# Patient Record
Sex: Female | Born: 1960 | Race: White | Hispanic: No | Marital: Married | Smoking: Former smoker
Health system: Southern US, Community
[De-identification: ages and names within clinical notes are randomized; demographics above are authoritative.]

## PROBLEM LIST (undated history)

## (undated) ENCOUNTER — Emergency Department (HOSPITAL_COMMUNITY): Payer: Self-pay

## (undated) DIAGNOSIS — E785 Hyperlipidemia, unspecified: Secondary | ICD-10-CM

## (undated) DIAGNOSIS — M952 Other acquired deformity of head: Secondary | ICD-10-CM

## (undated) DIAGNOSIS — F17200 Nicotine dependence, unspecified, uncomplicated: Secondary | ICD-10-CM

## (undated) DIAGNOSIS — E049 Nontoxic goiter, unspecified: Secondary | ICD-10-CM

## (undated) DIAGNOSIS — E663 Overweight: Secondary | ICD-10-CM

## (undated) DIAGNOSIS — IMO0002 Reserved for concepts with insufficient information to code with codable children: Secondary | ICD-10-CM

## (undated) DIAGNOSIS — F419 Anxiety disorder, unspecified: Secondary | ICD-10-CM

## (undated) DIAGNOSIS — G4726 Circadian rhythm sleep disorder, shift work type: Secondary | ICD-10-CM

## (undated) DIAGNOSIS — D069 Carcinoma in situ of cervix, unspecified: Secondary | ICD-10-CM

## (undated) DIAGNOSIS — B977 Papillomavirus as the cause of diseases classified elsewhere: Secondary | ICD-10-CM

## (undated) DIAGNOSIS — K5190 Ulcerative colitis, unspecified, without complications: Secondary | ICD-10-CM

## (undated) HISTORY — DX: Overweight: E66.3

## (undated) HISTORY — DX: Reserved for concepts with insufficient information to code with codable children: IMO0002

## (undated) HISTORY — DX: Nontoxic goiter, unspecified: E04.9

## (undated) HISTORY — DX: Nicotine dependence, unspecified, uncomplicated: F17.200

## (undated) HISTORY — PX: COLPOSCOPY W/ BIOPSY / CURETTAGE: SUR283

## (undated) HISTORY — DX: Anxiety disorder, unspecified: F41.9

## (undated) HISTORY — DX: Ulcerative colitis, unspecified, without complications: K51.90

## (undated) HISTORY — DX: Papillomavirus as the cause of diseases classified elsewhere: B97.7

## (undated) HISTORY — DX: Other acquired deformity of head: M95.2

## (undated) HISTORY — DX: Circadian rhythm sleep disorder, shift work type: G47.26

## (undated) HISTORY — DX: Hyperlipidemia, unspecified: E78.5

---

## 1994-11-18 DIAGNOSIS — K5190 Ulcerative colitis, unspecified, without complications: Secondary | ICD-10-CM

## 1994-11-18 HISTORY — DX: Ulcerative colitis, unspecified, without complications: K51.90

## 2003-01-26 ENCOUNTER — Ambulatory Visit (HOSPITAL_COMMUNITY): Admission: RE | Admit: 2003-01-26 | Discharge: 2003-01-26 | Payer: Self-pay | Admitting: Gastroenterology

## 2003-01-26 ENCOUNTER — Encounter (INDEPENDENT_AMBULATORY_CARE_PROVIDER_SITE_OTHER): Payer: Self-pay

## 2003-04-12 ENCOUNTER — Emergency Department (HOSPITAL_COMMUNITY): Admission: EM | Admit: 2003-04-12 | Discharge: 2003-04-12 | Payer: Self-pay

## 2005-07-17 ENCOUNTER — Ambulatory Visit: Payer: Self-pay | Admitting: Family Medicine

## 2005-08-01 ENCOUNTER — Ambulatory Visit: Payer: Self-pay | Admitting: Family Medicine

## 2008-12-08 ENCOUNTER — Ambulatory Visit: Payer: Self-pay | Admitting: Family Medicine

## 2008-12-21 ENCOUNTER — Ambulatory Visit: Payer: Self-pay | Admitting: Internal Medicine

## 2009-02-02 ENCOUNTER — Ambulatory Visit: Payer: Self-pay | Admitting: Unknown Physician Specialty

## 2009-03-28 ENCOUNTER — Other Ambulatory Visit: Admission: RE | Admit: 2009-03-28 | Discharge: 2009-03-28 | Payer: Self-pay | Admitting: Interventional Radiology

## 2009-03-28 ENCOUNTER — Encounter: Admission: RE | Admit: 2009-03-28 | Discharge: 2009-03-28 | Payer: Self-pay | Admitting: Surgery

## 2009-03-28 ENCOUNTER — Encounter (INDEPENDENT_AMBULATORY_CARE_PROVIDER_SITE_OTHER): Payer: Self-pay | Admitting: Interventional Radiology

## 2010-04-24 ENCOUNTER — Ambulatory Visit: Payer: Self-pay | Admitting: Otolaryngology

## 2010-12-24 IMAGING — MG MAM DIG ADDVIEWS RT SCR
1 series · 4 of 4 positions shown · non-contrast
Comparison: none

REASON FOR EXAM: right breast aysmmetrical spieculated density  US to
follow  spanish interpr...
COMMENTS:

[R ML · right · 4 of 4 slices shown]
[im 1/4]
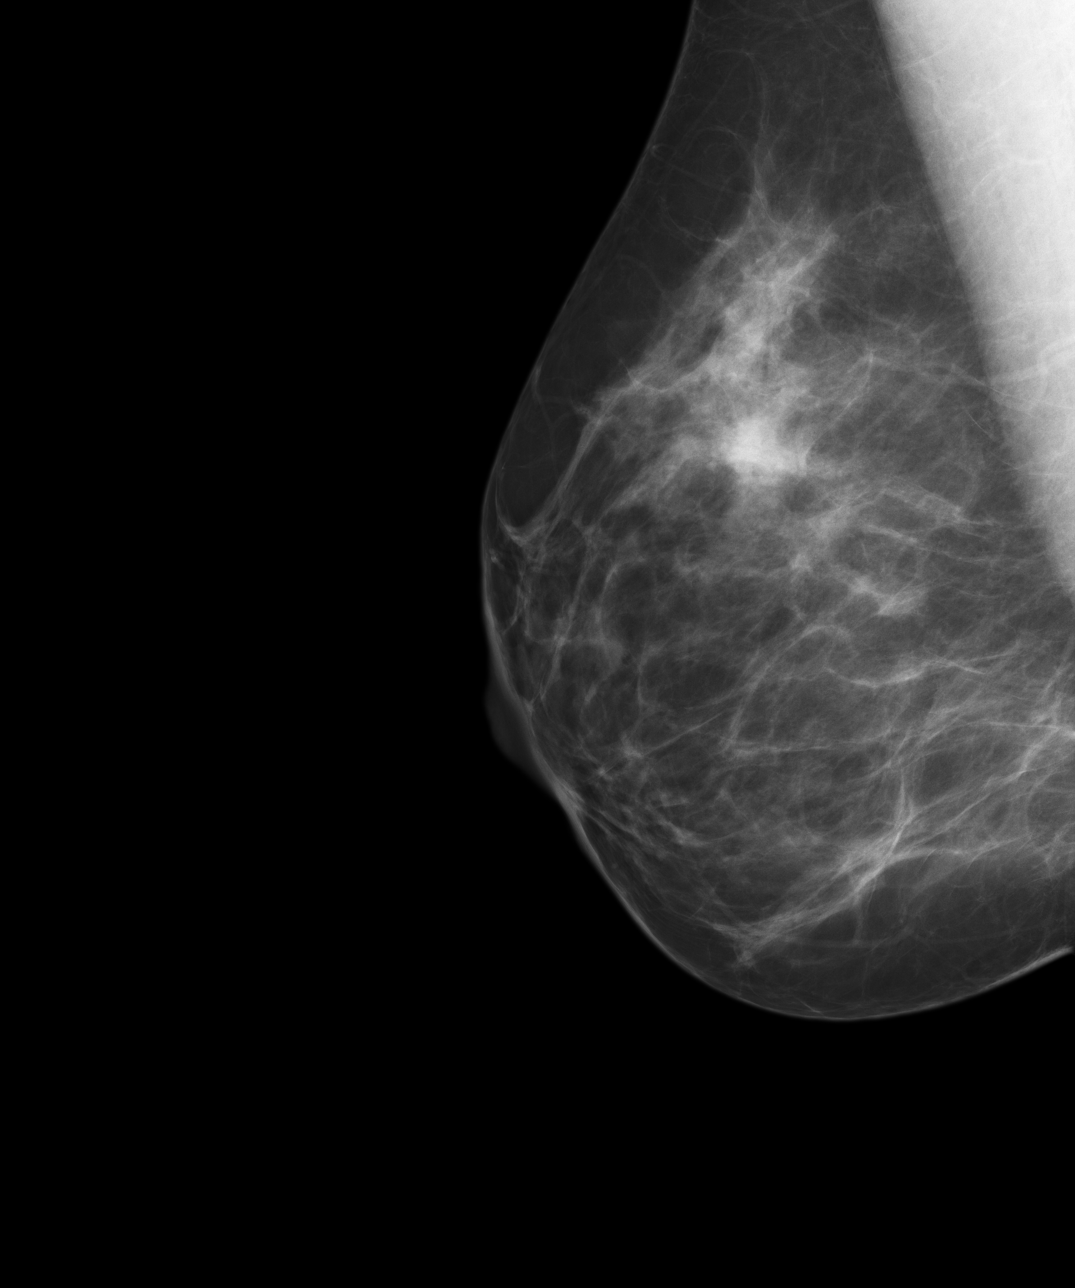
[im 2/4]
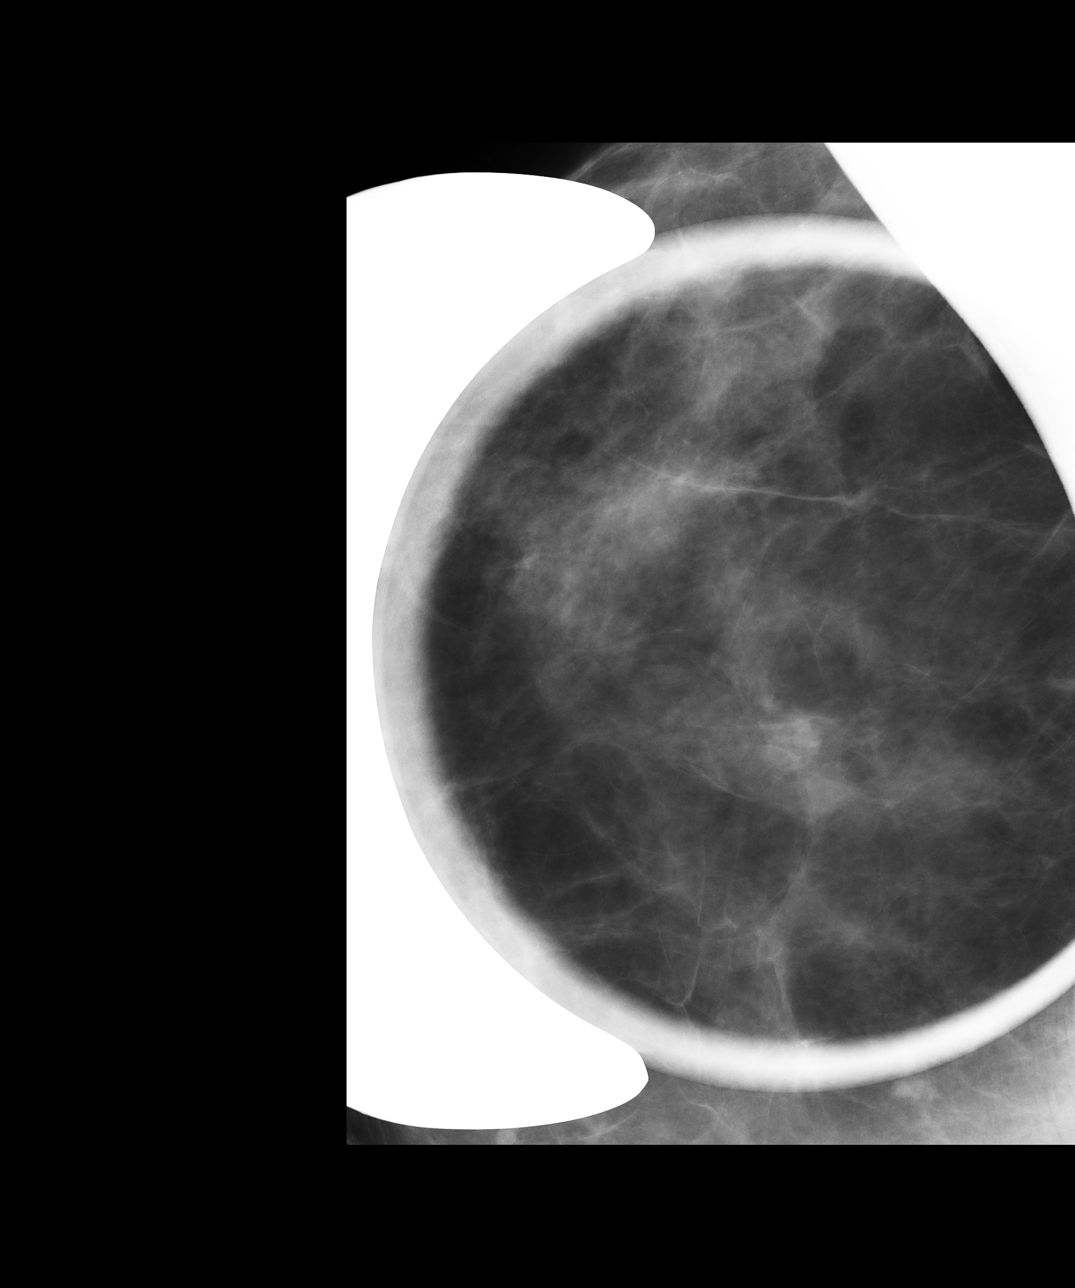
[im 3/4]
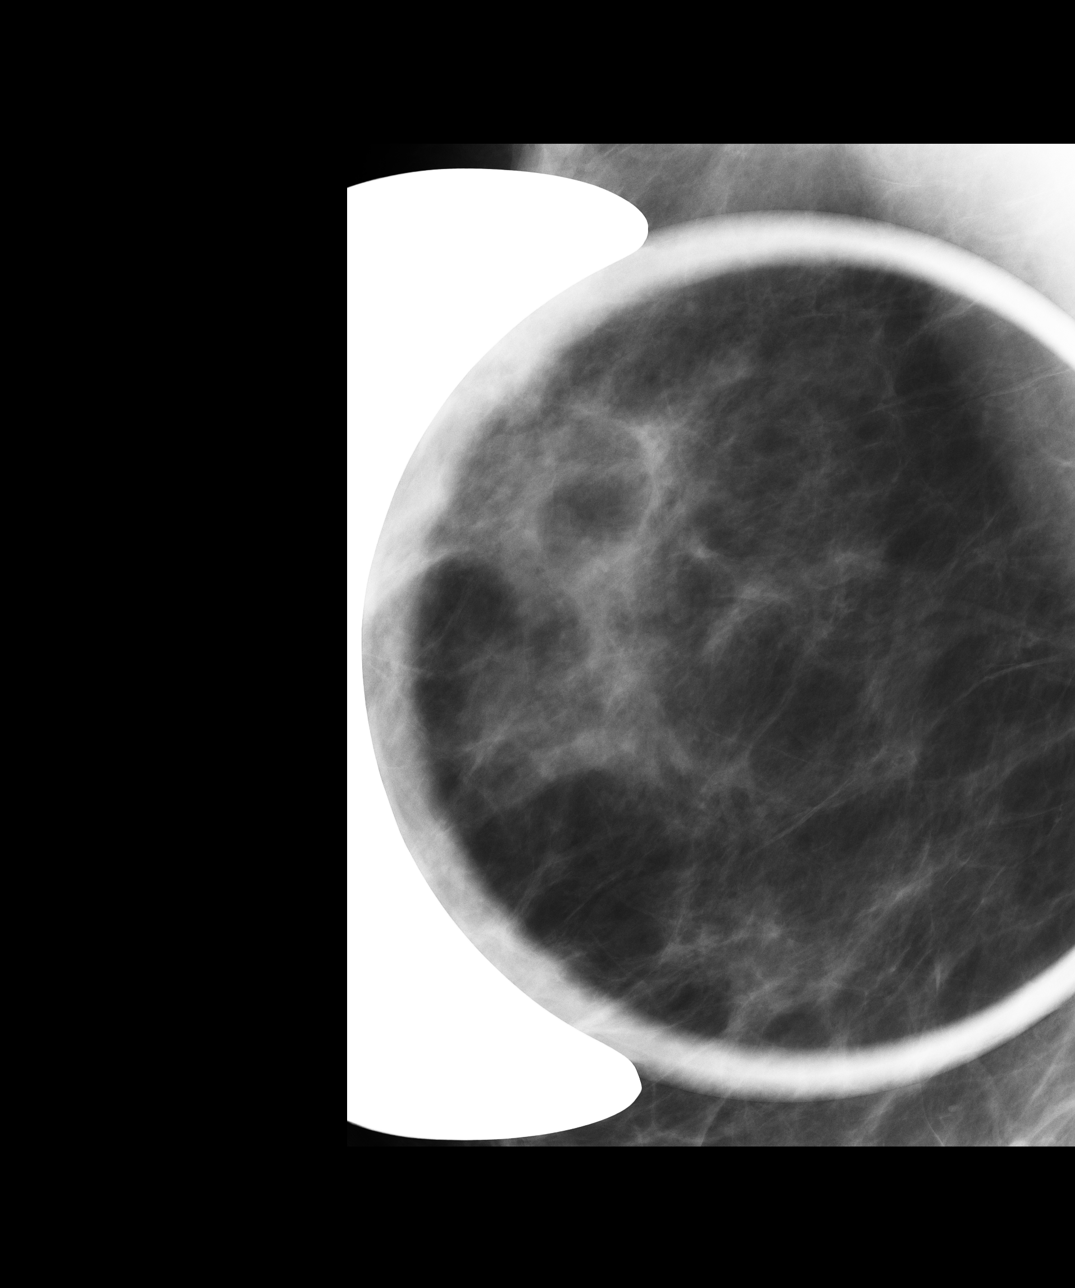
[im 4/4]
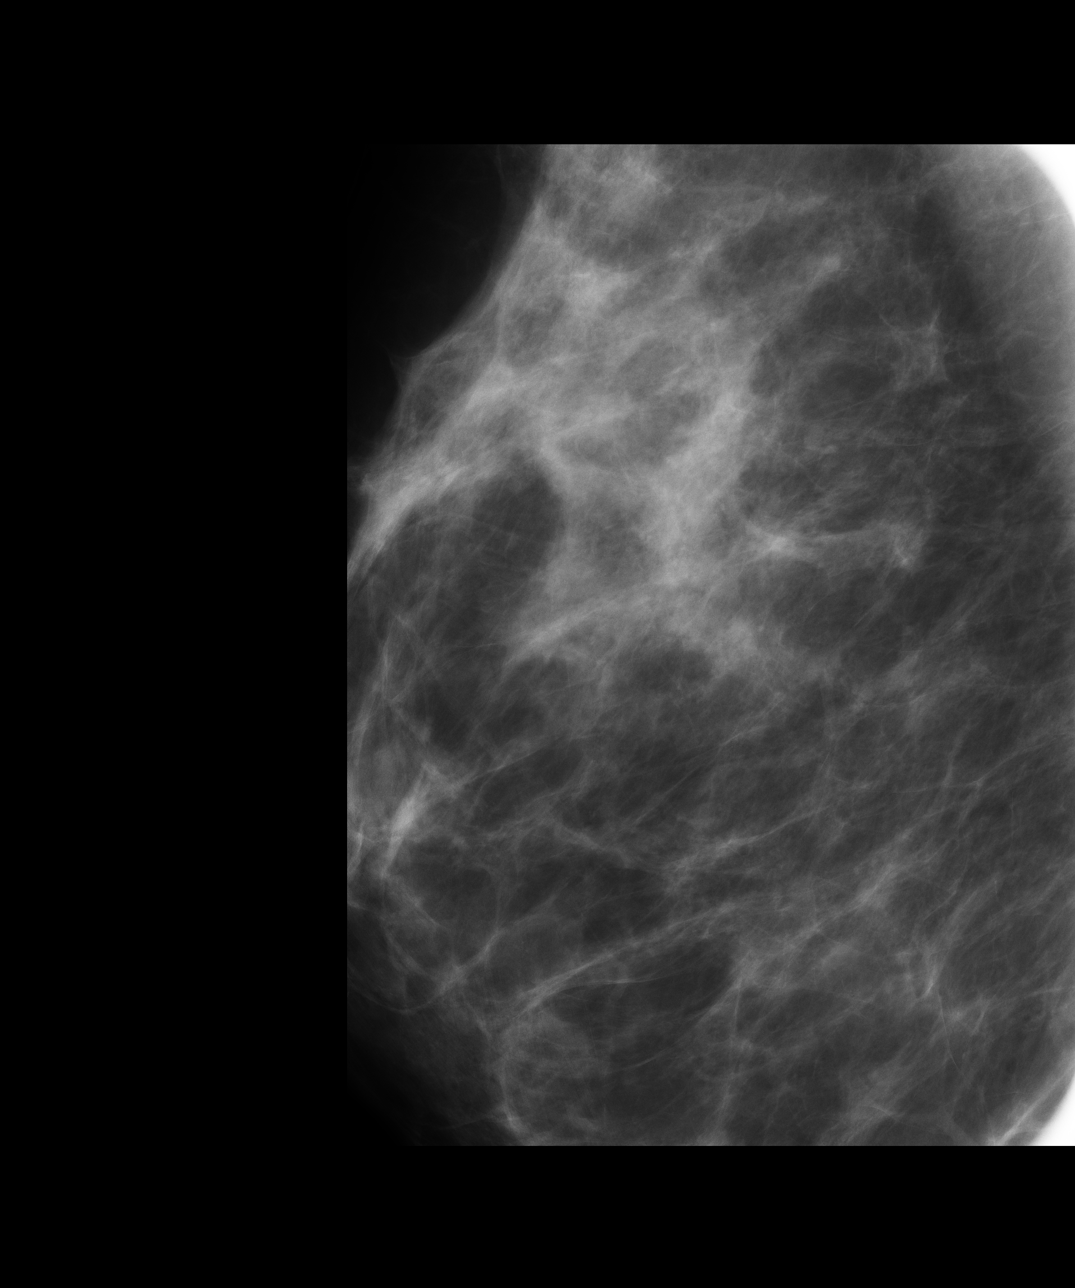

[4 of 4 positions shown; findings below may reference images not displayed]

PROCEDURE:     MAM - MAM DIG ADDVIEWS RT SCR  - December 21, 2008 [DATE]

RESULT:       The previous described area of asymmetric density was further
evaluated with magnification compression imaging.  This area appears to
primarily spread and blend with the normal breast parenchyma though there is
mild residual nodularity.  Sonographic evaluation of this region
demonstrates no sonographic abnormalities.  The residual nodularity is
primarily appreciated within the CC view.  Considering the radiographic and
sonographic findings this is likely a benign finding.  A 6-month
reevaluation to include radiographic evaluation is recommended.
IMPRESSION: 1.     Likely benign finding.
2.     BI-RADS: Category 3- Probably Benign Finding (interval follow up).

A NEGATIVE MAMMOGRAM REPORT DOES NOT PRECLUDE BIOPSY OR OTHER EVALUATION OF
A CLINICALLY PALPABLE OR OTHERWISE SUSPICIOUS MASS OR LESION.  BREAST CANCER
MAY NOT BE DETECTED BY MAMMOGRAPHY IN UP TO 10% OF CASES.

## 2011-04-05 NOTE — Op Note (Signed)
NAME:  Jennifer Oconnell                 ACCOUNT NO.:  [id number]   MEDICAL RECORD NO.:  [id number]                   PATIENT TYPE:  AMB   LOCATION:  ENDO                                 FACILITY:  [facility]   PHYSICIAN:  Bernette Redbird, M.D.                DATE OF BIRTH:  [birth year]   DATE OF PROCEDURE:  01/26/2003  DATE OF DISCHARGE:                                 OPERATIVE REPORT   PROCEDURE:  Colonoscopy with biopsies.   INDICATION:  A family history of colon cancer in a 50 year old female with  colonoscopy five years ago showing no adenomas.  In addition, the patient  has a roughly 10 year history of ulcerative colitis which has been under  excellent clinical control on Asacol 6 tablets daily.   FINDINGS:  Mildly inflamed segment in the sigmoid region as noted five years  ago.  Otherwise, normal to terminal ileum.   DESCRIPTION OF PROCEDURE:  The nature, purpose, and risks of the procedure  were familiar to the patient from prior examination.  She provided written  consent.  Sedation was fentanyl 100 mcg and Versed 8 mg IV without  arrhythmias or desaturation.   The Olympus adult video colonoscope was advanced to the terminal ileum  without significant difficulty.  Pullback was then performed.   It was a little bit tricky getting into the terminal ileum.  It took several  minutes to do so, but then we were able to advance a short distance into the  TI which had a normal mucosal appearance without evidence of ulcerations.  There was no evidence of Crohn's ileitis.  Pullback was then performed  around the colon in a gradual fashion.   Unlike five years ago, the quality of the prep this time was excellent, and  it is felt that all areas were well-seen.   The only area of inflammation on this exam was a 5 cm segment from roughly  20-25 cm, where there was slight loss of vascularity, slight erythema, and  minimal granularity and exudate.  On a scale from 1-10, where a 10  would be  the worst, I would estimate the level of inflammation at about 2-3.   Random mucosal biopsies for dysplasia surveillance were obtained along the  length of the colon, with the first couple of biopsies being mixed with some  random biopsies from the terminal ileum.   Biopsies from the inflamed segment were placed in a separate jar.  No  polyps, cancer, or diverticular disease or vascular malformations were noted  on this exam.   Retroflexion was not performed in the rectum, but antegrade viewing  disclosed no distal rectal lesions.  There were mild to moderate internal  hemorrhoids.   The patient tolerated this procedure very well, and there were no apparent  complications.   IMPRESSION:  1. Localized area of colonic inflammation, probably some mild smoldering     ulcerative colitis, path pending.  2. No evidence  of polyps in a patient with a family history of colon cancer.  3. Random biopsies obtained for dysplasia surveillance in view of the fact     that the patient has a 10 year history of ulcerative colitis.   PLAN:  Await pathology on biopsies.                                               Bernette Redbird, M.D.    RB/MEDQ  D:  01/26/2003  T:  01/26/2003  Job:  [id number]

## 2011-05-20 ENCOUNTER — Ambulatory Visit: Payer: Self-pay | Admitting: Otolaryngology

## 2012-11-18 DIAGNOSIS — Z9889 Other specified postprocedural states: Secondary | ICD-10-CM

## 2012-11-18 HISTORY — DX: Other acquired deformity of head: Z98.890

## 2012-11-18 HISTORY — PX: MOHS SURGERY: SUR867

## 2013-05-24 ENCOUNTER — Ambulatory Visit: Payer: Self-pay | Admitting: Family Medicine

## 2013-09-02 LAB — HM PAP SMEAR: HM PAP: ABNORMAL

## 2014-01-03 DIAGNOSIS — Z85828 Personal history of other malignant neoplasm of skin: Secondary | ICD-10-CM | POA: Insufficient documentation

## 2016-07-18 ENCOUNTER — Telehealth: Payer: Self-pay | Admitting: Family Medicine

## 2016-07-18 NOTE — Telephone Encounter (Signed)
She will be a new patient. We need to get previous notes into Epic. Okay to give her an appointment if any available

## 2016-08-13 ENCOUNTER — Encounter: Payer: Self-pay | Admitting: Family Medicine

## 2016-08-13 ENCOUNTER — Ambulatory Visit (INDEPENDENT_AMBULATORY_CARE_PROVIDER_SITE_OTHER): Payer: BLUE CROSS/BLUE SHIELD | Admitting: Family Medicine

## 2016-08-13 ENCOUNTER — Telehealth: Payer: Self-pay

## 2016-08-13 VITALS — BP 124/78 | HR 75 | Temp 98.1°F | Resp 16 | Ht 68.0 in | Wt 180.9 lb

## 2016-08-13 DIAGNOSIS — Z23 Encounter for immunization: Secondary | ICD-10-CM | POA: Diagnosis not present

## 2016-08-13 DIAGNOSIS — Z0000 Encounter for general adult medical examination without abnormal findings: Secondary | ICD-10-CM | POA: Diagnosis not present

## 2016-08-13 DIAGNOSIS — Z720 Tobacco use: Secondary | ICD-10-CM | POA: Insufficient documentation

## 2016-08-13 DIAGNOSIS — E049 Nontoxic goiter, unspecified: Secondary | ICD-10-CM

## 2016-08-13 DIAGNOSIS — Z8719 Personal history of other diseases of the digestive system: Secondary | ICD-10-CM | POA: Insufficient documentation

## 2016-08-13 DIAGNOSIS — Z01419 Encounter for gynecological examination (general) (routine) without abnormal findings: Secondary | ICD-10-CM

## 2016-08-13 DIAGNOSIS — Z1239 Encounter for other screening for malignant neoplasm of breast: Secondary | ICD-10-CM

## 2016-08-13 DIAGNOSIS — K5190 Ulcerative colitis, unspecified, without complications: Secondary | ICD-10-CM | POA: Insufficient documentation

## 2016-08-13 DIAGNOSIS — Z124 Encounter for screening for malignant neoplasm of cervix: Secondary | ICD-10-CM | POA: Diagnosis not present

## 2016-08-13 DIAGNOSIS — E785 Hyperlipidemia, unspecified: Secondary | ICD-10-CM

## 2016-08-13 DIAGNOSIS — R87612 Low grade squamous intraepithelial lesion on cytologic smear of cervix (LGSIL): Secondary | ICD-10-CM | POA: Insufficient documentation

## 2016-08-13 DIAGNOSIS — Z85828 Personal history of other malignant neoplasm of skin: Secondary | ICD-10-CM

## 2016-08-13 DIAGNOSIS — B977 Papillomavirus as the cause of diseases classified elsewhere: Secondary | ICD-10-CM | POA: Insufficient documentation

## 2016-08-13 NOTE — Telephone Encounter (Signed)
Called pt to discuss referrals. Pt need something that is after November 15th therefore discuss times and dates that would be best for her.    Jennifer LaurenceAmber Anasha Perfecto, CMA

## 2016-08-13 NOTE — Progress Notes (Signed)
Name: Jennifer Oconnell   MRN: [id number]    DOB: 11/20/1960   Date:08/13/2016       Progress Note  Subjective  Chief Complaint  Chief Complaint  Patient presents with  . Annual Exam    HPI  Well woman: she still has noticed irregular cycles, she has been skipping some cycles, normal flow, LMP: 08/10/2016, she denies dyspareunia, no change in bowel movements, no urinary incontinence.   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  Breast cancer screen: she refuses mammogram screen, she will only due diagnostic screen, she states she does not have any problems.  Ulcerative Colitis: no recent problems, denies change in bowel movements, but she has intermittent lower abdominal pain described as cramping, she states she has more constipation than diarrhea - takes otc medication prn. No blood in stools.   Skin cancer history: she refuses referral to dermatologist at this time. Seen at [facility], explained importance of yearly screen with her history, but she refuses at this time  Patient Active Problem List   Diagnosis Date Noted  . Goiter 08/13/2016  . 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 Ulcerative colitis without complications (HCC) 08/13/2016  . Tobacco use 08/13/2016  . LGSIL of cervix of undetermined significance 08/13/2016  . Dyslipidemia 08/13/2016  . Personal history of other malignant neoplasm of skin 01/03/2014    Past Surgical History:  Procedure Laterality Date  . MOHS SURGERY  2014   Face    Family History  Problem Relation Age of Onset  . Cancer Mother     colon  . Cancer Father     Lung  . Cancer Brother      Lung    Social History   Social History  . Marital status: Single    Spouse name: N/A  . Number of children: N/A  . Years of education: N/A   Occupational History  . Not on file.   Social History Main Topics  . Smoking status: Current Every Day Smoker    Years: 40.00    Types: Cigarettes    Start date: 08/13/1976  . Smokeless tobacco: Never Used     Comment: 3 cigarettes a day  . Alcohol use No  . Drug use: No  . Sexual activity: Yes    Partners: Male    Birth control/ protection: None   Other Topics Concern  . Not on file   Social History Narrative  . No narrative on file     Current Outpatient Prescriptions:  .  Ascorbic Acid (VITAMIN C) 1000 MG tablet, Take by mouth., Disp: , Rfl:   No Known Allergies   ROS  Constitutional: Negative for fever or weight change.  Respiratory: Negative for cough and shortness of breath.   Cardiovascular: Negative for chest pain or palpitations.  Gastrointestinal: Negative for abdominal pain, no bowel changes.  Musculoskeletal: Negative for gait problem or joint swelling.  Skin: Negative for rash.  Neurological: Negative for dizziness or headache.  No other specific complaints in a complete review of systems (except as listed in HPI above).  Objective  Vitals:   08/13/16 1221  BP: 124/78  Pulse: 75  Resp: 16  Temp: 98.1 F (36.7 C)  TempSrc: Oral  SpO2: 98%  Weight: 180 lb 14.4 oz (82.1 kg)  Height: 5\' 8"  (1.727 m)    Body mass index is 27.51 kg/m.  Physical Exam  Constitutional: Patient appears well-developed and well-nourished. No distress.  HENT: Head: Normocephalic and atraumatic. Ears: B TMs ok, no erythema or effusion; Nose: Nose normal. Mouth/Throat: Oropharynx is clear and moist. No oropharyngeal exudate.  Eyes: Conjunctivae and EOM are normal. Pupils are equal, round, and reactive to light. No scleral icterus.  Neck: Normal range of motion. Neck supple. No JVD present. No thyromegaly present.   Cardiovascular: Normal rate, regular rhythm and normal heart sounds.  No murmur heard. No BLE edema. Pulmonary/Chest: Effort normal and breath sounds normal. No respiratory distress. Abdominal: Soft. Bowel sounds are normal, no distension. There is no tenderness. no masses Breast: no lumps or masses, no nipple discharge or rashes FEMALE GENITALIA:  Not done RECTAL: not done Musculoskeletal: Normal range of motion, no joint effusions. No gross deformities Neurological: he is alert and oriented to person, place, and time. No cranial nerve deficit. Coordination, balance, strength, speech and gait are normal.  Skin: Skin is warm and dry. No rash noted. No erythema.  Psychiatric: Patient has a normal mood and affect. behavior is normal. Judgment and thought content normal.  PHQ2/9: Depression screen PHQ 2/9 08/13/2016  Decreased Interest 0  Down, Depressed, Hopeless 0  PHQ - 2 Score 0     Fall Risk: Fall Risk  08/13/2016  Falls in the past year? No      Functional Status Survey: Is the patient deaf or have difficulty hearing?: No Does the patient have difficulty seeing, even when wearing glasses/contacts?: No Does the patient have difficulty concentrating, remembering, or making decisions?: No Does the patient have difficulty walking or climbing stairs?: No Does the patient have difficulty dressing or bathing?: No Does the patient have difficulty doing errands alone such as visiting a doctor's office or shopping?: No   Assessment & Plan  1. Well woman exam  Discussed importance of 150 minutes of physical activity weekly, eat two servings of fish weekly, eat one serving of tree nuts ( cashews, pistachios, pecans, almonds.Marland Kitchen.) every other day, eat 6 servings of fruit/vegetables daily and drink plenty of water and avoid sweet beverages.  - CBC with Differential/Platelet - COMPLETE METABOLIC PANEL WITH GFR - Hemoglobin A1c  2. Cervical cancer screening  On her cycle, history of  abnormal pap , go back to gyn  3. Breast cancer screening  Refused breast screen   4. Dyslipidemia  - Lipid panel  5. High risk HPV infection  Needs pap smear -refer back to Gyn  6. Personal history of other malignant neoplasm of skin  Needs to have yearly exam, but refuses  7. Ulcerative colitis without complications, unspecified location (HCC)  - Sedimentation rate - C-reactive protein - Ambulatory referral to Gastroenterology  8. Goiter  - TSH - US Soft Tissue Head/Neck; Future  9. Needs flu shot  refused

## 2016-10-04 ENCOUNTER — Ambulatory Visit
Admission: RE | Admit: 2016-10-04 | Discharge: 2016-10-04 | Disposition: A | Discharge status: Home / Self Care | Payer: Commercial Managed Care - PPO | Source: Ambulatory Visit | Attending: Family Medicine | Admitting: Family Medicine

## 2016-10-04 DIAGNOSIS — E049 Nontoxic goiter, unspecified: Secondary | ICD-10-CM

## 2016-10-04 DIAGNOSIS — E042 Nontoxic multinodular goiter: Secondary | ICD-10-CM | POA: Diagnosis not present

## 2017-07-28 ENCOUNTER — Ambulatory Visit (INDEPENDENT_AMBULATORY_CARE_PROVIDER_SITE_OTHER): Payer: Commercial Managed Care - PPO | Admitting: Family Medicine

## 2017-07-28 ENCOUNTER — Encounter: Payer: Self-pay | Admitting: Family Medicine

## 2017-07-28 VITALS — BP 118/76 | HR 74 | Temp 98.4°F | Resp 16 | Ht 68.0 in | Wt 187.3 lb

## 2017-07-28 DIAGNOSIS — Z85828 Personal history of other malignant neoplasm of skin: Secondary | ICD-10-CM

## 2017-07-28 DIAGNOSIS — Z23 Encounter for immunization: Secondary | ICD-10-CM | POA: Diagnosis not present

## 2017-07-28 DIAGNOSIS — B977 Papillomavirus as the cause of diseases classified elsewhere: Secondary | ICD-10-CM

## 2017-07-28 DIAGNOSIS — Z131 Encounter for screening for diabetes mellitus: Secondary | ICD-10-CM

## 2017-07-28 DIAGNOSIS — Z1239 Encounter for other screening for malignant neoplasm of breast: Secondary | ICD-10-CM

## 2017-07-28 DIAGNOSIS — K5190 Ulcerative colitis, unspecified, without complications: Secondary | ICD-10-CM

## 2017-07-28 DIAGNOSIS — Z01419 Encounter for gynecological examination (general) (routine) without abnormal findings: Secondary | ICD-10-CM

## 2017-07-28 DIAGNOSIS — Z13220 Encounter for screening for lipoid disorders: Secondary | ICD-10-CM | POA: Diagnosis not present

## 2017-07-28 DIAGNOSIS — E049 Nontoxic goiter, unspecified: Secondary | ICD-10-CM

## 2017-07-28 DIAGNOSIS — Z1211 Encounter for screening for malignant neoplasm of colon: Secondary | ICD-10-CM

## 2017-07-28 DIAGNOSIS — E785 Hyperlipidemia, unspecified: Secondary | ICD-10-CM

## 2017-07-28 DIAGNOSIS — Z1231 Encounter for screening mammogram for malignant neoplasm of breast: Secondary | ICD-10-CM | POA: Diagnosis not present

## 2017-07-28 DIAGNOSIS — Z124 Encounter for screening for malignant neoplasm of cervix: Secondary | ICD-10-CM

## 2017-07-28 NOTE — Patient Instructions (Signed)
Preventive Care 40-64 Years, Female Preventive care refers to lifestyle choices and visits with your health care provider that can promote health and wellness. What does preventive care include?  A yearly physical exam. This is also called an annual well check.  Dental exams once or twice a year.  Routine eye exams. Ask your health care provider how often you should have your eyes checked.  Personal lifestyle choices, including: ? Daily care of your teeth and gums. ? Regular physical activity. ? Eating a healthy diet. ? Avoiding tobacco and drug use. ? Limiting alcohol use. ? Practicing safe sex. ? Taking low-dose aspirin daily starting at age 58. ? Taking vitamin and mineral supplements as recommended by your health care provider. What happens during an annual well check? The services and screenings done by your health care provider during your annual well check will depend on your age, overall health, lifestyle risk factors, and family history of disease. Counseling Your health care provider may ask you questions about your:  Alcohol use.  Tobacco use.  Drug use.  Emotional well-being.  Home and relationship well-being.  Sexual activity.  Eating habits.  Work and work Statistician.  Method of birth control.  Menstrual cycle.  Pregnancy history.  Screening You may have the following tests or measurements:  Height, weight, and BMI.  Blood pressure.  Lipid and cholesterol levels. These may be checked every 5 years, or more frequently if you are over 81 years old.  Skin check.  Lung cancer screening. You may have this screening every year starting at age 78 if you have a 30-pack-year history of smoking and currently smoke or have quit within the past 15 years.  Fecal occult blood test (FOBT) of the stool. You may have this test every year starting at age 65.  Flexible sigmoidoscopy or colonoscopy. You may have a sigmoidoscopy every 5 years or a colonoscopy  every 10 years starting at age 30.  Hepatitis C blood test.  Hepatitis B blood test.  Sexually transmitted disease (STD) testing.  Diabetes screening. This is done by checking your blood sugar (glucose) after you have not eaten for a while (fasting). You may have this done every 1-3 years.  Mammogram. This may be done every 1-2 years. Talk to your health care provider about when you should start having regular mammograms. This may depend on whether you have a family history of breast cancer.  BRCA-related cancer screening. This may be done if you have a family history of breast, ovarian, tubal, or peritoneal cancers.  Pelvic exam and Pap test. This may be done every 3 years starting at age 80. Starting at age 36, this may be done every 5 years if you have a Pap test in combination with an HPV test.  Bone density scan. This is done to screen for osteoporosis. You may have this scan if you are at high risk for osteoporosis.  Discuss your test results, treatment options, and if necessary, the need for more tests with your health care provider. Vaccines Your health care provider may recommend certain vaccines, such as:  Influenza vaccine. This is recommended every year.  Tetanus, diphtheria, and acellular pertussis (Tdap, Td) vaccine. You may need a Td booster every 10 years.  Varicella vaccine. You may need this if you have not been vaccinated.  Zoster vaccine. You may need this after age 5.  Measles, mumps, and rubella (MMR) vaccine. You may need at least one dose of MMR if you were born in  1957 or later. You may also need a second dose.  Pneumococcal 13-valent conjugate (PCV13) vaccine. You may need this if you have certain conditions and were not previously vaccinated.  Pneumococcal polysaccharide (PPSV23) vaccine. You may need one or two doses if you smoke cigarettes or if you have certain conditions.  Meningococcal vaccine. You may need this if you have certain  conditions.  Hepatitis A vaccine. You may need this if you have certain conditions or if you travel or work in places where you may be exposed to hepatitis A.  Hepatitis B vaccine. You may need this if you have certain conditions or if you travel or work in places where you may be exposed to hepatitis B.  Haemophilus influenzae type b (Hib) vaccine. You may need this if you have certain conditions.  Talk to your health care provider about which screenings and vaccines you need and how often you need them. This information is not intended to replace advice given to you by your health care provider. Make sure you discuss any questions you have with your health care provider. Document Released: 12/01/2015 Document Revised: 07/24/2016 Document Reviewed: 09/05/2015 Elsevier Interactive Patient Education  2017 [employer].

## 2017-07-28 NOTE — Progress Notes (Signed)
Name: Jennifer Oconnell   MRN: [id number]    DOB: 1961-03-12   Date:07/28/2017       Progress Note  Subjective  Chief Complaint  Chief Complaint  Patient presents with  . Annual Exam    HPI  Well Woman: she is feeling well, gained about 7 lbs since last visit. She eats healthy, but not as active. She works at [employer], also works weekends for [employer]. She is due for labs and colonoscopy, pap smear and mammogram. She is 52 and needs to consider starting aspirin 81 mg daily however advised her to wait until seen by GI, because of history of colitis.    Patient Active Problem List   Diagnosis Date Noted  . Goiter 08/13/2016  . 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 Ulcerative colitis without complications (HCC) 08/13/2016  . Tobacco use 08/13/2016  . LGSIL of cervix of undetermined significance 08/13/2016  . Dyslipidemia 08/13/2016  . Personal history of other malignant neoplasm of skin 01/03/2014    Past Surgical History:  Procedure Laterality Date  . MOHS SURGERY  2014   Face    Family History  Problem Relation Age of Onset  . Cancer Mother        colon  . Cancer Father        Lung  . Cancer Brother        Lung    Social History   Social History  . Marital status: Married    Spouse name: N/A  . Number of children: N/A  . Years of education: N/A   Occupational History  . Interior and spatial designerdirector of nursing  [employer]   Social History Main Topics  . Smoking status: Former Smoker    Years: 40.00    Types: Cigarettes    Start date: 08/13/1976    Quit date: 07/19/2017  . Smokeless tobacco: Never Used     Comment: 3 cigarettes a day  . Alcohol use Not on file  . Drug use: No  . Sexual activity: Yes    Partners: Male    Birth control/ protection: None   Other Topics Concern  . Not on file   Social History Narrative   Married, 8 biological children, 2 step-children and 7 adopted children.    She has 19 grandchildren   Working at [employer] - director of nursing       Current Outpatient Prescriptions:  .  Nutritional Supplements (JUICE PLUS FIBRE PO), Take by mouth., Disp: , Rfl:  .  Ascorbic Acid (VITAMIN C) 1000 MG tablet, Take by mouth., Disp: , Rfl:   No Known Allergies   ROS  Constitutional: Negative for fever or weight change.  Respiratory: Negative for cough and shortness of breath.   Cardiovascular: Negative for chest pain or palpitations.  Gastrointestinal: Negative for abdominal pain, no bowel changes - she states chronic constipation and takes otc medication prn .  Musculoskeletal: Negative for gait problem or joint swelling.  Skin: Negative for rash.  Neurological: Negative for dizziness or headache.  No other specific complaints in a complete review of systems (except as listed in HPI above).  Objective  Vitals:   07/28/17 1423  BP: 118/76  Pulse: 74  Resp: 16  Temp: 98.4 F (36.9 C)  SpO2: 98%  Weight: 187 lb 5 oz (85 kg)  Height: 5\' 8"  (1.727 m)    Body mass index is 28.48 kg/m.  Physical Exam  Constitutional: Patient appears well-developed and well-nourished. No distress.  HENT:  Head: Normocephalic and atraumatic. Ears: B TMs ok, no erythema or effusion; Nose: Nose normal. Mouth/Throat: Oropharynx is clear and moist. No oropharyngeal exudate.  Eyes: Conjunctivae and EOM are normal. Pupils are equal, round, and reactive to light. No scleral icterus.  Neck: Normal range of motion. Neck supple. No JVD present. No thyromegaly present.  Cardiovascular: Normal rate, regular rhythm and normal heart sounds.  No murmur heard. No BLE edema. Pulmonary/Chest: Effort normal and breath sounds normal. No respiratory distress. Abdominal: Soft. Bowel sounds are normal, no distension. There is no tenderness. no masses Breast: no lumps or masses, no nipple discharge or rashes FEMALE GENITALIA:  External genitalia normal External urethra normal Vaginal vault normal without discharge or lesions Cervix normal without discharge or  lesions Bimanual exam normal without masses RECTAL: not done Musculoskeletal: Normal range of motion, no joint effusions. No gross deformities Neurological: he is alert and oriented to person, place, and time. No cranial nerve deficit. Coordination, balance, strength, speech and gait are normal.  Skin: Skin is warm and dry. No rash noted. No erythema.  Psychiatric: Patient has a normal mood and affect. behavior is normal. Judgment and thought content normal.   PHQ2/9: Depression screen [facility] 2/9 07/28/2017 08/13/2016  Decreased Interest 0 0  Down, Depressed, Hopeless 0 0  PHQ - 2 Score 0 0     Fall Risk: Fall Risk  07/28/2017 08/13/2016  Falls in the past year? No No     Functional Status Survey: Is the patient deaf or have difficulty hearing?: No Does the patient have difficulty seeing, even when wearing glasses/contacts?: No Does the patient have difficulty concentrating, remembering, or making decisions?: No Does the patient have difficulty walking or climbing stairs?: No Does the patient have difficulty dressing or bathing?: No Does the patient have difficulty doing errands alone such as visiting a doctor's office or shopping?: No   Assessment & Plan  1. Well woman exam  Discussed importance of 150 minutes of physical activity weekly, eat two servings of fish weekly, eat one serving of tree nuts ( cashews, pistachios, pecans, almonds.Marland Kitchen) every other day, eat 6 servings of fruit/vegetables daily and drink plenty of water and avoid sweet beverages.  - CBC with Differential/Platelet - COMPLETE METABOLIC PANEL WITH GFR - Hemoglobin A1c - Insulin, fasting - Lipid panel - Thyroid Panel With TSH - Pap IG and HPV (high risk) DNA detection  2. Ulcerative colitis without complications, unspecified location [facility])  - Ambulatory referral to Gastroenterology -C-reactive protein   3. Breast cancer screening  - MM Digital Screening; Future  4. Cervical cancer screening  - Pap IG  and HPV (high risk) DNA detection  5. High risk HPV infection  - Pap IG and HPV (high risk) DNA detection  6. Personal history of other malignant neoplasm of skin  Advised to go back to Dermatologist   7. Needs flu shot  Refused flu shot  8. Goiter  - Thyroid Panel With TSH  9. Screening for diabetes mellitus (DM)  - Hemoglobin A1c - Insulin, fasting  10. Lipid screening  - Lipid panel  11. Screening for colon cancer  - Ambulatory referral to Gastroenterology

## 2017-07-29 NOTE — Addendum Note (Signed)
Addended by: Lelon FrohlichGOLDEN, TASHIA D on: 07/29/2017 08:36 AM   Modules accepted: Orders

## 2017-07-30 LAB — CBC WITH DIFFERENTIAL/PLATELET
BASOS PCT: 0.8 %
Basophils Absolute: 58 cells/uL (ref 0–200)
EOS ABS: 180 {cells}/uL (ref 15–500)
Eosinophils Relative: 2.5 %
HEMATOCRIT: 43.8 % (ref 35.0–45.0)
Hemoglobin: 14.3 g/dL (ref 11.7–15.5)
LYMPHS ABS: 2275 {cells}/uL (ref 850–3900)
MCH: 27.6 pg (ref 27.0–33.0)
MCHC: 32.6 g/dL (ref 32.0–36.0)
MCV: 84.4 fL (ref 80.0–100.0)
MPV: 11.1 fL (ref 7.5–12.5)
Monocytes Relative: 7.6 %
Neutro Abs: 4140 cells/uL (ref 1500–7800)
Neutrophils Relative %: 57.5 %
PLATELETS: 194 10*3/uL (ref 140–400)
RBC: 5.19 10*6/uL — ABNORMAL HIGH (ref 3.80–5.10)
RDW: 13.6 % (ref 11.0–15.0)
TOTAL LYMPHOCYTE: 31.6 %
WBC: 7.2 10*3/uL (ref 3.8–10.8)
WBCMIX: 547 {cells}/uL (ref 200–950)

## 2017-07-30 LAB — COMPLETE METABOLIC PANEL WITH GFR
AG Ratio: 1.8 (calc) (ref 1.0–2.5)
ALT: 9 U/L (ref 6–29)
AST: 12 U/L (ref 10–35)
Albumin: 4.3 g/dL (ref 3.6–5.1)
Alkaline phosphatase (APISO): 100 U/L (ref 33–130)
BUN: 12 mg/dL (ref 7–25)
CALCIUM: 9.4 mg/dL (ref 8.6–10.4)
CO2: 25 mmol/L (ref 20–32)
CREATININE: 0.87 mg/dL (ref 0.50–1.05)
Chloride: 105 mmol/L (ref 98–110)
GFR, EST AFRICAN AMERICAN: 88 mL/min/{1.73_m2} (ref >=60)
GFR, EST NON AFRICAN AMERICAN: 76 mL/min/{1.73_m2} (ref >=60)
GLUCOSE: 106 mg/dL — AB (ref 65–99)
Globulin: 2.4 g/dL (calc) (ref 1.9–3.7)
Potassium: 4.2 mmol/L (ref 3.5–5.3)
Sodium: 140 mmol/L (ref 135–146)
TOTAL PROTEIN: 6.7 g/dL (ref 6.1–8.1)
Total Bilirubin: 0.4 mg/dL (ref 0.2–1.2)

## 2017-07-30 LAB — HEMOGLOBIN A1C
Hgb A1c MFr Bld: 5.4 % of total Hgb (ref <5.7)
Mean Plasma Glucose: 108 (calc)
eAG (mmol/L): 6 (calc)

## 2017-07-30 LAB — PAP IG, CT-NG NAA, HPV HIGH-RISK
C. TRACHOMATIS RNA, TMA: NOT DETECTED
HPV DNA High Risk: DETECTED — AB
N. GONORRHOEAE RNA, TMA: NOT DETECTED

## 2017-07-30 LAB — LIPID PANEL
CHOL/HDL RATIO: 4.5 (calc) (ref <5.0)
Cholesterol: 219 mg/dL — ABNORMAL HIGH (ref <200)
HDL: 49 mg/dL — ABNORMAL LOW (ref >50)
LDL CHOLESTEROL (CALC): 148 mg/dL — AB
NON-HDL CHOLESTEROL (CALC): 170 mg/dL — AB (ref <130)
TRIGLYCERIDES: 103 mg/dL (ref <150)

## 2017-07-30 LAB — INSULIN, FASTING: INSULIN: 9 u[IU]/mL (ref 2.0–19.6)

## 2017-07-30 LAB — THYROID PANEL WITH TSH
Free Thyroxine Index: 2.1 (ref 1.4–3.8)
T3 UPTAKE: 30 % (ref 22–35)
T4, Total: 6.9 ug/dL (ref 5.1–11.9)
TSH: 1.68 m[IU]/L

## 2017-07-30 LAB — C-REACTIVE PROTEIN: CRP: 4.5 mg/L (ref <8.0)

## 2017-08-01 ENCOUNTER — Other Ambulatory Visit: Payer: Self-pay | Admitting: Family Medicine

## 2017-08-01 DIAGNOSIS — R87810 Cervical high risk human papillomavirus (HPV) DNA test positive: Secondary | ICD-10-CM | POA: Insufficient documentation

## 2017-08-01 DIAGNOSIS — R87619 Unspecified abnormal cytological findings in specimens from cervix uteri: Secondary | ICD-10-CM | POA: Insufficient documentation

## 2017-08-04 ENCOUNTER — Telehealth: Payer: Self-pay | Admitting: Family Medicine

## 2017-08-04 NOTE — Telephone Encounter (Signed)
Pt checking status on lab work from last week. Please return call [phone]

## 2017-08-05 ENCOUNTER — Encounter: Payer: Self-pay | Admitting: Family Medicine

## 2017-08-06 ENCOUNTER — Telehealth: Payer: Self-pay | Admitting: Obstetrics & Gynecology

## 2017-08-06 NOTE — Telephone Encounter (Signed)
Pt is being referred by [facility] for Cervical high risk HPV (human papillomavirus) test positive and- Atypical glandular cells on cervical Pap smear. Spoke with patient about schedule patient states she would call back to schedule at an later time.

## 2017-08-06 NOTE — Telephone Encounter (Signed)
Pt aware of results 

## 2017-08-07 NOTE — Telephone Encounter (Signed)
Attempted to reach patient to schedule spoke with patient then phone line was disconnected.

## 2017-08-19 NOTE — Telephone Encounter (Signed)
Pt wants to cancel referral due to schedule appt with another provider.

## 2018-03-04 ENCOUNTER — Ambulatory Visit
Admission: RE | Admit: 2018-03-04 | Discharge: 2018-03-04 | Disposition: A | Discharge status: Home / Self Care | Payer: Commercial Managed Care - PPO | Source: Ambulatory Visit | Attending: Family Medicine | Admitting: Family Medicine

## 2018-03-04 DIAGNOSIS — Z1231 Encounter for screening mammogram for malignant neoplasm of breast: Secondary | ICD-10-CM | POA: Insufficient documentation

## 2018-03-04 DIAGNOSIS — Z1239 Encounter for other screening for malignant neoplasm of breast: Secondary | ICD-10-CM

## 2018-03-23 DIAGNOSIS — Z8742 Personal history of other diseases of the female genital tract: Secondary | ICD-10-CM | POA: Insufficient documentation

## 2018-03-23 DIAGNOSIS — D069 Carcinoma in situ of cervix, unspecified: Secondary | ICD-10-CM | POA: Insufficient documentation

## 2018-04-18 HISTORY — PX: OTHER SURGICAL HISTORY: SHX169

## 2018-06-15 LAB — HM COLONOSCOPY

## 2018-06-22 ENCOUNTER — Encounter: Payer: Self-pay | Admitting: Family Medicine

## 2018-07-29 ENCOUNTER — Ambulatory Visit (INDEPENDENT_AMBULATORY_CARE_PROVIDER_SITE_OTHER): Payer: Commercial Managed Care - PPO | Admitting: Family Medicine

## 2018-07-29 ENCOUNTER — Ambulatory Visit
Admission: RE | Admit: 2018-07-29 | Discharge: 2018-07-29 | Disposition: A | Discharge status: Home / Self Care | Payer: Commercial Managed Care - PPO | Source: Ambulatory Visit | Attending: Family Medicine | Admitting: Family Medicine

## 2018-07-29 ENCOUNTER — Encounter: Payer: Self-pay | Admitting: Family Medicine

## 2018-07-29 VITALS — BP 122/76 | HR 77 | Temp 98.4°F | Resp 16 | Ht 68.0 in | Wt 192.5 lb

## 2018-07-29 DIAGNOSIS — E663 Overweight: Secondary | ICD-10-CM | POA: Diagnosis not present

## 2018-07-29 DIAGNOSIS — F17200 Nicotine dependence, unspecified, uncomplicated: Secondary | ICD-10-CM | POA: Insufficient documentation

## 2018-07-29 DIAGNOSIS — Z720 Tobacco use: Secondary | ICD-10-CM

## 2018-07-29 DIAGNOSIS — J410 Simple chronic bronchitis: Secondary | ICD-10-CM | POA: Insufficient documentation

## 2018-07-29 DIAGNOSIS — F419 Anxiety disorder, unspecified: Secondary | ICD-10-CM

## 2018-07-29 DIAGNOSIS — Z131 Encounter for screening for diabetes mellitus: Secondary | ICD-10-CM

## 2018-07-29 DIAGNOSIS — E785 Hyperlipidemia, unspecified: Secondary | ICD-10-CM | POA: Diagnosis not present

## 2018-07-29 DIAGNOSIS — R0789 Other chest pain: Secondary | ICD-10-CM

## 2018-07-29 DIAGNOSIS — Z0000 Encounter for general adult medical examination without abnormal findings: Secondary | ICD-10-CM | POA: Diagnosis not present

## 2018-07-29 DIAGNOSIS — E049 Nontoxic goiter, unspecified: Secondary | ICD-10-CM | POA: Diagnosis not present

## 2018-07-29 LAB — COMPLETE METABOLIC PANEL WITH GFR
AG RATIO: 1.8 (calc) (ref 1.0–2.5)
ALBUMIN MSPROF: 4.4 g/dL (ref 3.6–5.1)
ALT: 15 U/L (ref 6–29)
AST: 16 U/L (ref 10–35)
Alkaline phosphatase (APISO): 98 U/L (ref 33–130)
BILIRUBIN TOTAL: 0.4 mg/dL (ref 0.2–1.2)
BUN: 17 mg/dL (ref 7–25)
CALCIUM: 9.7 mg/dL (ref 8.6–10.4)
CHLORIDE: 104 mmol/L (ref 98–110)
CO2: 29 mmol/L (ref 20–32)
Creat: 1.05 mg/dL (ref 0.50–1.05)
GFR, EST NON AFRICAN AMERICAN: 60 mL/min/{1.73_m2} (ref >=60)
GFR, Est African American: 70 mL/min/{1.73_m2} (ref >=60)
Globulin: 2.5 g/dL (calc) (ref 1.9–3.7)
Glucose, Bld: 86 mg/dL (ref 65–139)
POTASSIUM: 3.9 mmol/L (ref 3.5–5.3)
Sodium: 141 mmol/L (ref 135–146)
TOTAL PROTEIN: 6.9 g/dL (ref 6.1–8.1)

## 2018-07-29 LAB — LIPID PANEL
Cholesterol: 249 mg/dL — ABNORMAL HIGH (ref <200)
HDL: 52 mg/dL (ref >50)
LDL Cholesterol (Calc): 171 mg/dL (calc) — ABNORMAL HIGH
Non-HDL Cholesterol (Calc): 197 mg/dL (calc) — ABNORMAL HIGH (ref <130)
TRIGLYCERIDES: 126 mg/dL (ref <150)
Total CHOL/HDL Ratio: 4.8 (calc) (ref <5.0)

## 2018-07-29 LAB — TSH: TSH: 0.85 mIU/L

## 2018-07-29 MED ORDER — LORCASERIN HCL 10 MG PO TABS: 10.0000 mg | ORAL_TABLET | Freq: Two times a day (BID) | ORAL | 1 refills | Status: DC

## 2018-07-29 MED ORDER — HYDROXYZINE HCL 10 MG PO TABS: 10.0000 mg | ORAL_TABLET | Freq: Three times a day (TID) | ORAL | 0 refills | Status: DC | PRN

## 2018-07-29 NOTE — Progress Notes (Addendum)
Name: Jennifer Oconnell   MRN: [id number]    DOB: 1961/07/05   Date:07/29/2018       Progress Note  Subjective  Chief Complaint  Chief Complaint  Patient presents with  . 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 She smokes about 3 cigarettes a day; stopped from 1992-2000, then again for 3 years, has been back to smoking for about 2 years.  She requests CXR order today. Drinks 1 energy drink a day in the afternoon to help her get through her evening hours. Tobacco cessation discussed for approximately 2 minutes - pt declines today.  Weight management: She would like to explore medication options - discussed Saxenda, qysemia, belviq; we will try belviq as first option.  Diet: Breakfast meat and cheese roll up, chicken for lunch, salad for dinner, not snacking, does not drink any sweet drinks. Exercise: Does not exercise regularly, does want to lose weight and would like to discuss weight management medications.   USPSTF grade A and B recommendations    [facility] Visit from 07/29/2018 in [facility]  AUDIT-C Score  0     Depression:  Depression screen [facility] 2/9 07/29/2018 07/28/2017 08/13/2016  Decreased Interest 0 0 0  Down, Depressed, Hopeless 0 0 0  PHQ - 2 Score 0 0 0  Altered sleeping 0 - -  Tired, decreased energy 0 - -  Change in appetite 0 - -  Feeling bad or failure about yourself  0 - -  Trouble concentrating 0 - -  Moving slowly or fidgety/restless 0 - -  Suicidal thoughts 0 - -  Difficult doing work/chores Not difficult at all - -   Hypertension: BP Readings from Last 3 Encounters:  07/29/18 122/76  07/28/17 118/76  08/13/16 124/78   Obesity: Wt Readings from Last 3  Encounters:  07/29/18 192 lb 8 oz (87.3 kg)  07/28/17 187 lb 5 oz (85 kg)  08/13/16 180 lb 14.4 oz (82.1 kg)   BMI Readings from Last 3 Encounters:  07/29/18 29.27 kg/m  07/28/17 28.48 kg/m  08/13/16 27.51 kg/m    Hep C Screening: 2014 done and negative STD testing and prevention (HIV/chl/gon/syphilis): Declines Intimate partner violence: No concerns Sexual History/Pain during Intercourse: No pain with intercourse Menstrual History/LMP/Abnormal Bleeding: No abnormal vaginal bleeding Incontinence Symptoms: No concerns  Advanced Care Planning: A voluntary discussion about advance care planning including the explanation and discussion of advance directives.  Discussed health care proxy and Living will, and the patient was able to identify a health care proxy as Daughter-In-Law Felipe Drone.  Patient does not have a living will at present time. If patient does have living will, I have requested they bring this to the clinic to be scanned in to their chart.  Breast cancer: UTD No results found for: HMMAMMO  BRCA gene screening: No family history; no personal history Cervical cancer screening: Had recent colposcopy - Dr. Gustavo Lah  Osteoporosis Screening: No personal or family history; not due for screening yet. No results found for: HMDEXASCAN  Lipids:  Lab Results  Component Value Date   CHOL 219 (H) 07/29/2017   Lab Results  Component Value Date   HDL 49 (L) 07/29/2017  Lab Results  Component Value Date   LDLCALC 148 (H) 07/29/2017   Lab Results  Component Value Date   TRIG 103 07/29/2017   Lab Results  Component Value Date   CHOLHDL 4.5 07/29/2017   No results found for: LDLDIRECT  Glucose:  Glucose, Bld  Date Value Ref Range Status  07/29/2017 106 (H) 65 - 99 mg/dL Final    Comment:    .            Fasting reference interval . For someone without known diabetes, a glucose value between 100 and 125 mg/dL is consistent with prediabetes and should be  confirmed with a follow-up test. .     Skin cancer: no concerning lesions; used to see dermatology for basal cell carcinoma several years ago, has not been back since then. Colorectal cancer: Had recent colonscopy   Lung cancer:  Will do chest Xray today; Low Dose CT Chest recommended if Age 55-80 years, 30 pack-year currently smoking OR have quit w/in 15years. Patient does not qualify.   ECG: We will do today  Patient Active Problem List   Diagnosis Date Noted  . Atypical glandular cells on cervical Pap smear 08/01/2017  . Cervical high risk HPV (human papillomavirus) test positive 08/01/2017  . Goiter 08/13/2016  . 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 Ulcerative colitis without complications ([place]) 78/24/2353  . Tobacco use 08/13/2016  . LGSIL of cervix of undetermined significance 08/13/2016  . Dyslipidemia 08/13/2016  . Personal history of other malignant neoplasm of skin 01/03/2014    Past Surgical History:  Procedure Laterality Date  . MOHS SURGERY  2014   Face    Family History  Problem Relation Age of Onset  . Cancer Mother        colon  . Cancer Father        Lung  . Cancer Brother        Lung    Social History   Socioeconomic History  . Marital status: Married    Spouse name: Magda Paganini  . Number of children: 70  . Years of education: Not on file  . Highest education level: Not on file  Occupational History  . Occupation: Stage manager: [employer]  . Financial resource strain: Not hard at all  . Food insecurity:    Worry: Never true    Inability: Never true  . Transportation needs:    Medical: No    Non-medical: No  Tobacco Use  . Smoking status: Former Smoker    Years: 40.00    Types: Cigarettes    Start date: 08/13/1976    Last attempt to quit: 07/19/2017    Years since quitting: 1.0  . Smokeless tobacco: Never Used  . Tobacco comment: 3 cigarettes a day  Substance and Sexual Activity  . Alcohol use:  Not on file  . Drug use: No  . Sexual activity: Yes    Partners: Male    Birth control/protection: None  Lifestyle  . Physical activity:    Days per week: 0 days    Minutes per session: 0 min  . Stress: Only a little  Relationships  . Social connections:    Talks on phone: More than three times a week    Gets together: More than three times a week    Attends religious service: More than 4 times per year    Active member of club or organization: Yes  Attends meetings of clubs or organizations: 1 to 4 times per year    Relationship status: Married  . Intimate partner violence:    Fear of current or ex partner: No    Emotionally abused: No    Physically abused: No    Forced sexual activity: No  Other Topics Concern  . Not on file  Social History Narrative   Married, 8 biological children, 2 step-children and 7 adopted children.    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  Ascorbic Acid (VITAMIN C) 1000 MG tablet, Take by mouth., Disp: , Rfl:  .  Nutritional Supplements (JUICE PLUS FIBRE PO), Take by mouth., Disp: , Rfl:   No Known Allergies   ROS  Constitutional: Negative for fever or weight change.  Respiratory: Negative for cough and shortness of breath.   Cardiovascular: Negative for chest pain or palpitations.  Gastrointestinal: Negative for abdominal pain, no bowel changes.  Musculoskeletal: Negative for gait problem or joint swelling.  Skin: Negative for rash.  Neurological: Negative for dizziness or headache.  No other specific complaints in a complete review of systems (except as listed in HPI above).  Objective  Vitals:   07/29/18 1058  BP: 122/76  Pulse: 77  Resp: 16  Temp: 98.4 F (36.9 C)  TempSrc: Oral  SpO2: 98%  Weight: 192 lb 8 oz (87.3 kg)  Height: 5' 8"  (1.727 m)    Body mass index is 29.27 kg/m.  Physical Exam Constitutional: Patient appears well-developed and  well-nourished. No distress.  HENT: Head: Normocephalic and atraumatic. Ears: B TMs ok, no erythema or effusion; Nose: Nose normal. Mouth/Throat: Oropharynx is clear and moist. No oropharyngeal exudate.  Eyes: Conjunctivae and EOM are normal. Pupils are equal, round, and reactive to light. No scleral icterus.  Neck: Normal range of motion. Neck supple. No JVD present. No thyromegaly present.  Cardiovascular: Normal rate, regular rhythm and normal heart sounds.  No murmur heard. No BLE edema. Pulmonary/Chest: Effort normal and breath sounds normal. No respiratory distress. Abdominal: Soft. Bowel sounds are normal, no distension. There is no tenderness. no masses Breast: no lumps or masses, no nipple discharge or rashes FEMALE GENITALIA: Deferred. Musculoskeletal: Normal range of motion, no joint effusions. No gross deformities Neurological: he is alert and oriented to person, place, and time. No cranial nerve deficit. Coordination, balance, strength, speech and gait are normal.  Skin: Skin is warm and dry. No rash noted. No erythema.  Psychiatric: Patient has a normal mood and affect. behavior is normal. Judgment and thought content normal.  Recent Results (from the past 2160 hour(s))  HM COLONOSCOPY     Status: None   Collection Time: 06/10/18 12:00 AM  Result Value Ref Range   HM Colonoscopy See Report (in chart) See Report (in chart), Patient Reported    Comment: [facility], Dr. Donnella Sham, [facility]    PHQ2/9: Depression screen [facility] 2/9 07/29/2018 07/28/2017 08/13/2016  Decreased Interest 0 0 0  Down, Depressed, Hopeless 0 0 0  PHQ - 2 Score 0 0 0  Altered sleeping 0 - -  Tired, decreased energy 0 - -  Change in appetite 0 - -  Feeling bad or failure about yourself  0 - -  Trouble concentrating 0 - -  Moving slowly or fidgety/restless 0 - -  Suicidal thoughts 0 - -  Difficult doing work/chores Not difficult at all - -  Fall Risk: Fall Risk  07/29/2018  07/28/2017 08/13/2016  Falls in the past year? No No No    Assessment & Plan  1. Well woman exam (no gynecological exam) - EKG 12-Lead - Lipid panel - COMPLETE METABOLIC PANEL WITH GFR - TSH -USPSTF grade A and B recommendations reviewed with patient; age-appropriate recommendations, preventive care, screening tests, etc discussed and encouraged; healthy living encouraged; see AVS for patient education given to patient -Discussed importance of 150 minutes of physical activity weekly, eat two servings of fish weekly, eat one serving of tree nuts ( cashews, pistachios, pecans, almonds.Marland Kitchen) every other day, eat 6 servings of fruit/vegetables daily and drink plenty of water and avoid sweet beverages.   2. Overweight (BMI 25.0-29.9) - Lorcaserin HCl 10 MG TABS; Take 10 mg by mouth 2 (two) times daily.  Dispense: 60 tablet; Refill: 1 - Lipid panel - COMPLETE METABOLIC PANEL WITH GFR - TSH - Intensive lifestyle modification is encouraged, follow up in 6-8 weeks with PCP Dr. Ancil Boozer  3. Dyslipidemia - Lipid panel  4. Goiter - TSH - Is not seeing Endocrinology any longer; had [place] in 2017 which did not indicate biopsy at that time. Has some minor changes in size of nodules.  5. Chest discomfort - EKG 12-Lead - DG Chest 2 View; Future - Advised likely related to anxiety as it occurs when she feels anxious and is in the right lower ribcage.  Did discuss signs and symptoms of MI - arm pain, severe chest pain, shoulder blade pain, and when to present for ER care  6. Anxiety - hydrOXYzine (ATARAX/VISTARIL) 10 MG tablet; Take 1 tablet (10 mg total) by mouth every 8 (eight) hours as needed for anxiety.  Dispense: 15 tablet; Refill: 0 - Declines daily medication.  7. Tobacco use - Encouraged cessation, pt declines.  8. Diabetes mellitus screening - COMPLETE METABOLIC PANEL WITH GFR  -Reviewed Health Maintenance: Declines flu shot today

## 2018-07-30 NOTE — Addendum Note (Signed)
Addended by: Doren CustardBOYCE, EMILY E on: 07/30/2018 07:47 AM   Modules accepted: Orders

## 2018-08-02 ENCOUNTER — Encounter: Payer: Self-pay | Admitting: Family Medicine

## 2018-08-02 DIAGNOSIS — E663 Overweight: Secondary | ICD-10-CM

## 2018-08-04 MED ORDER — BUPROPION HCL ER (XL) 150 MG PO TB24: 150.0000 mg | ORAL_TABLET | Freq: Every day | ORAL | 0 refills | Status: DC

## 2018-08-04 NOTE — Addendum Note (Signed)
Addended by: Doren CustardBOYCE, Yochanan Eddleman E on: 08/04/2018 03:32 PM   Modules accepted: Orders

## 2019-09-15 ENCOUNTER — Ambulatory Visit (INDEPENDENT_AMBULATORY_CARE_PROVIDER_SITE_OTHER): Payer: Commercial Managed Care - PPO | Admitting: Family Medicine

## 2019-09-15 ENCOUNTER — Other Ambulatory Visit: Payer: Self-pay

## 2019-09-15 ENCOUNTER — Encounter: Payer: Self-pay | Admitting: Family Medicine

## 2019-09-15 VITALS — BP 136/90 | HR 80 | Temp 97.3°F | Resp 16 | Ht 67.0 in | Wt 203.0 lb

## 2019-09-15 DIAGNOSIS — Z1231 Encounter for screening mammogram for malignant neoplasm of breast: Secondary | ICD-10-CM | POA: Diagnosis not present

## 2019-09-15 DIAGNOSIS — Z114 Encounter for screening for human immunodeficiency virus [HIV]: Secondary | ICD-10-CM | POA: Diagnosis not present

## 2019-09-15 DIAGNOSIS — Z0000 Encounter for general adult medical examination without abnormal findings: Secondary | ICD-10-CM

## 2019-09-15 DIAGNOSIS — F341 Dysthymic disorder: Secondary | ICD-10-CM | POA: Insufficient documentation

## 2019-09-15 DIAGNOSIS — Z131 Encounter for screening for diabetes mellitus: Secondary | ICD-10-CM

## 2019-09-15 DIAGNOSIS — Z23 Encounter for immunization: Secondary | ICD-10-CM

## 2019-09-15 DIAGNOSIS — E049 Nontoxic goiter, unspecified: Secondary | ICD-10-CM

## 2019-09-15 DIAGNOSIS — D069 Carcinoma in situ of cervix, unspecified: Secondary | ICD-10-CM

## 2019-09-15 DIAGNOSIS — E785 Hyperlipidemia, unspecified: Secondary | ICD-10-CM | POA: Diagnosis not present

## 2019-09-15 NOTE — Progress Notes (Signed)
Name: Jennifer Oconnell   MRN: [id number]    DOB: Apr 15, 1961   Date:09/15/2019       Progress Note  Subjective  Chief Complaint  Chief Complaint  Patient presents with  . Annual Exam    HPI  Patient presents for annual CPE.  Dysthymia: she has a long history of depression, she refuses medication, she states she is feeling okay, COVID-19 is causing some stress   Skin lesion: on the left side of her nose, it is a growth, she picks on it and it resolves but grows again, she has a history of skin cancer and explained importance of seeing dermatologist again, but she refuses to go at this time  CINIII: status post cone biopsy but has not gone back for repeat pap smear, advised to have it done today but she refused, she states she will go to gyn   Dyslipidemia: last LDL was high, but low ASCVD is still low, but needs to stop smoking and exercise more   The 10-year ASCVD risk score Mikey Bussing DC Brooke Bonito., et al., 2013) is: 7.2%   Values used to calculate the score:     Age: 58 years     Sex: Female     Is Non-Hispanic African American: No     Diabetic: No     Tobacco smoker: Yes     Systolic Blood Pressure: 326 mmHg     Is BP treated: No     HDL Cholesterol: 52 mg/dL     Total Cholesterol: 249 mg/dL   Obesity: she is going to try walking 30 minutes 3 days a week, she seems to have a balanced diet.   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, clear margins  Diet: balanced diet, does not eat much but upset about weight gain Exercise: no physically active   USPSTF grade A and B recommendations    Office Visit from 07/29/2018 in [facility]  AUDIT-C Score  0     Depression: Phq 9 is  Positive  Depression screen [facility] 2/9 09/15/2019 09/15/2019 07/29/2018 07/28/2017 08/13/2016  Decreased Interest 0 0 0 0 0  Down, Depressed, Hopeless 1 0 0 0 0  PHQ - 2 Score 1 0 0 0 0  Altered sleeping 0 0 0 - -  Tired, decreased energy 0 0 0 - -  Change in appetite 0 0 0 - -  Feeling  bad or failure about yourself  0 0 0 - -  Trouble concentrating 0 0 0 - -  Moving slowly or fidgety/restless 0 0 0 - -  Suicidal thoughts 0 0 0 - -  PHQ-9 Score 1 0 - - -  Difficult doing work/chores Not difficult at all - Not difficult at all - -   Hypertension: BP Readings from Last 3 Encounters:  09/15/19 136/90  07/29/18 122/76  07/28/17 118/76   Obesity: Wt Readings from Last 3 Encounters:  09/15/19 203 lb (92.1 kg)  07/29/18 192 lb 8 oz (87.3 kg)  07/28/17 187 lb 5 oz (85 kg)   BMI Readings from Last 3 Encounters:  09/15/19 31.79 kg/m  07/29/18 29.27 kg/m  07/28/17 28.48 kg/m     Hep C Screening: 2014 negative STD testing and prevention (HIV/chl/gon/syphilis): not interested  Intimate partner violence: negative screen  Sexual History/Pain during Intercourse: no pain during intercourse Menstrual History/LMP/Abnormal Bleeding: s/p menopause, discussed post-menopausal bleeding  Incontinence Symptoms: none   Breast cancer:  - Last Mammogram: 02/2018  - BRCA gene  screening: N/A  Osteoporosis: discussed high calcium and vitamin D supplementation  Cervical cancer screening: offered to repeat pap today but she wants to go back to gyn   Skin cancer: discussed atypical lesions , she has noticed a lesion on left side of her nose, history of skin cancer, she would like to go to [facility] but not ready to go now, explained it may be skin cancer Colorectal cancer: 05/2018 repeat in 3 years  Lung cancer:   Low Dose CT Chest recommended if Age 74-80 years, 30 pack-year currently smoking OR have quit w/in 15years. Patient does not qualify.   ECG: today   Advanced Care Planning: A voluntary discussion about advance care planning including the explanation and discussion of advance directives.  Discussed health care proxy and Living will, and the patient was able to identify a health care proxy as daughter in law Felipe Drone .  Patient does have a living will at present  time. If patient does have living will, I have requested they bring this to the clinic to be scanned in to their chart.  Lipids: Lab Results  Component Value Date   CHOL 249 (H) 07/29/2018   CHOL 219 (H) 07/29/2017   Lab Results  Component Value Date   HDL 52 07/29/2018   HDL 49 (L) 07/29/2017   Lab Results  Component Value Date   LDLCALC 171 (H) 07/29/2018   LDLCALC 148 (H) 07/29/2017   Lab Results  Component Value Date   TRIG 126 07/29/2018   TRIG 103 07/29/2017   Lab Results  Component Value Date   CHOLHDL 4.8 07/29/2018   CHOLHDL 4.5 07/29/2017   No results found for: LDLDIRECT  Glucose: Glucose, Bld  Date Value Ref Range Status  07/29/2018 86 65 - 139 mg/dL Final    Comment:    .        Non-fasting reference interval .   07/29/2017 106 (H) 65 - 99 mg/dL Final    Comment:    .            Fasting reference interval . For someone without known diabetes, a glucose value between 100 and 125 mg/dL is consistent with prediabetes and should be confirmed with a follow-up test. .     Patient Active Problem List   Diagnosis Date Noted  . Overweight (BMI 25.0-29.9) 07/29/2018  . Anxiety 07/29/2018  . CIN III (cervical intraepithelial neoplasia III) 03/23/2018  . Atypical glandular cells on cervical Pap smear 08/01/2017  . Cervical high risk HPV (human papillomavirus) test positive 08/01/2017  . Goiter 08/13/2016  . 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 Ulcerative colitis without complications ([place]) 22/33/6122  . Tobacco use 08/13/2016  . LGSIL of cervix of undetermined significance 08/13/2016  . Dyslipidemia 08/13/2016  . Personal history of other malignant neoplasm of skin 01/03/2014    Past Surgical History:  Procedure Laterality Date  . MOHS SURGERY  2014   Face    Family History  Problem Relation Age of Onset  . Cancer Mother        colon  . Cancer Father        Lung  . Cancer Brother        Lung    Social History   Socioeconomic  History  . Marital status: Married    Spouse name: Magda Paganini  . Number of children: 79  . Years of education: Not on file  . Highest education level: Associate degree: occupational, Hotel manager, or vocational program  Occupational History  . Occupation: Stage manager: [employer]  . Financial resource strain: Not hard at all  . Food insecurity    Worry: Never true    Inability: Never true  . Transportation needs    Medical: No    Non-medical: No  Tobacco Use  . Smoking status: Current Every Day Smoker    Years: 40.00    Types: Cigarettes    Start date: 08/13/1976  . Smokeless tobacco: Never Used  . Tobacco comment: 3 cigarettes a day  Substance and Sexual Activity  . Alcohol use: Not on file  . Drug use: No  . Sexual activity: Yes    Partners: Male    Birth control/protection: None  Lifestyle  . Physical activity    Days per week: 0 days    Minutes per session: 0 min  . Stress: Only a little  Relationships  . Social connections    Talks on phone: More than three times a week    Gets together: More than three times a week    Attends religious service: More than 4 times per year    Active member of club or organization: Yes    Attends meetings of clubs or organizations: 1 to 4 times per year    Relationship status: Married  . Intimate partner violence    Fear of current or ex partner: No    Emotionally abused: No    Physically abused: No    Forced sexual activity: No  Other Topics Concern  . Not on file  Social History Narrative   Married, 8 biological children, 2 step-children and 7 adopted children.    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  Ascorbic Acid (VITAMIN C) 1000 MG tablet, Take by mouth., Disp: , Rfl:  .  BLACK COHOSH HOT FLASH RELIEF PO, Take by mouth., Disp: , Rfl:  .  cyanocobalamin 100 MCG tablet, Take 1,000 mcg by mouth daily., Disp: ,  Rfl:  .  Turmeric (QC TUMERIC COMPLEX PO), Take by mouth., Disp: , Rfl:  .  buPROPion (WELLBUTRIN XL) 150 MG 24 hr tablet, Take 1 tablet (150 mg total) by mouth daily. (Patient not taking: Reported on 09/15/2019), Disp: 90 tablet, Rfl: 0 .  hydrOXYzine (ATARAX/VISTARIL) 10 MG tablet, Take 1 tablet (10 mg total) by mouth every 8 (eight) hours as needed for anxiety. (Patient not taking: Reported on 09/15/2019), Disp: 15 tablet, Rfl: 0 .  Nutritional Supplements (JUICE PLUS FIBRE PO), Take by mouth., Disp: , Rfl:   No Known Allergies   ROS  Constitutional: Negative for fever or weight change.  Respiratory: Negative for cough and shortness of breath.   Cardiovascular: Negative for chest pain or palpitations.  Gastrointestinal: Negative for abdominal pain, no bowel changes.  Musculoskeletal: Negative for gait problem or joint swelling.  Skin: Negative for rash.  Neurological: Negative for dizziness or headache.  No other specific complaints in a complete review of systems (except as listed in HPI above).  Objective  Vitals:   09/15/19 0944  BP: 136/90  Pulse: 80  Resp: 16  Temp: (!) 97.3 F (36.3 C)  TempSrc: Temporal  SpO2: 99%  Weight: 203 lb (92.1 kg)  Height: 5' 7"  (1.702 m)    Body mass index is 31.79 kg/m.  Physical Exam  Constitutional: Patient appears well-developed and well-nourished. No  distress.  HENT: Head: Normocephalic and atraumatic. Ears: B TMs ok, no erythema or effusion; Nose: Nose normal. Mouth/Throat: Oropharynx is clear and moist. No oropharyngeal exudate.  Eyes: Conjunctivae and EOM are normal. Pupils are equal, round, and reactive to light. No scleral icterus.  Neck: Normal range of motion. Neck supple. No JVD present. No thyromegaly present.  Cardiovascular: Normal rate, regular rhythm and normal heart sounds.  No murmur heard. No BLE edema. Pulmonary/Chest: Effort normal and breath sounds normal. No respiratory distress. Abdominal: Soft. Bowel sounds  are normal, no distension. There is no tenderness. no masses Breast: no lumps or masses, no nipple discharge or rashes FEMALE GENITALIA:  Not done RECTAL: not done Musculoskeletal: Normal range of motion, no joint effusions. No gross deformities Neurological: he is alert and oriented to person, place, and time. No cranial nerve deficit. Coordination, balance, strength, speech and gait are normal.  Skin: Skin is warm and dry. Raised lesion on left side of nose  Psychiatric: Patient has a normal mood and affect. behavior is normal. Judgment and thought content normal.  Fall Risk: Fall Risk  09/15/2019 07/29/2018 07/28/2017 08/13/2016  Falls in the past year? 0 No No No  Number falls in past yr: 0 - - -  Injury with Fall? 0 - - -     Functional Status Survey: Is the patient deaf or have difficulty hearing?: No Does the patient have difficulty seeing, even when wearing glasses/contacts?: No Does the patient have difficulty concentrating, remembering, or making decisions?: No Does the patient have difficulty walking or climbing stairs?: No Does the patient have difficulty dressing or bathing?: No Does the patient have difficulty doing errands alone such as visiting a doctor's office or shopping?: No   Assessment & Plan  1. Dyslipidemia  - Lipid panel  2. Encounter for screening for HIV  Refused   3. Need for Tdap vaccination  - Tdap vaccine greater than or equal to 7yo IM  4. Encounter for screening mammogram for malignant neoplasm of breast  - MM 3D SCREEN BREAST BILATERAL  5. Well adult exam  - COMPLETE METABOLIC PANEL WITH GFR - CBC with Differential/Platelet  6. Goiter  - Thyroid Panel With TSH  7. Diabetes mellitus screening  - Hemoglobin A1c  8. History of CIN   She needs to follow up with gyn   9. Dysthymia  She does not want medication, states doing well    -USPSTF grade A and B recommendations reviewed with patient; age-appropriate recommendations,  preventive care, screening tests, etc discussed and encouraged; healthy living encouraged; see AVS for patient education given to patient -Discussed importance of 150 minutes of physical activity weekly, eat two servings of fish weekly, eat one serving of tree nuts ( cashews, pistachios, pecans, almonds.Marland Kitchen) every other day, eat 6 servings of fruit/vegetables daily and drink plenty of water and avoid sweet beverages.

## 2019-09-15 NOTE — Patient Instructions (Signed)

## 2019-09-16 ENCOUNTER — Encounter: Payer: Self-pay | Admitting: Family Medicine

## 2019-09-16 LAB — CBC WITH DIFFERENTIAL/PLATELET
Absolute Monocytes: 482 cells/uL (ref 200–950)
Basophils Absolute: 63 cells/uL (ref 0–200)
Basophils Relative: 0.8 %
Eosinophils Absolute: 221 cells/uL (ref 15–500)
Eosinophils Relative: 2.8 %
HCT: 45.5 % — ABNORMAL HIGH (ref 35.0–45.0)
Hemoglobin: 14.8 g/dL (ref 11.7–15.5)
Lymphs Abs: 2338 cells/uL (ref 850–3900)
MCH: 27.7 pg (ref 27.0–33.0)
MCHC: 32.5 g/dL (ref 32.0–36.0)
MCV: 85.2 fL (ref 80.0–100.0)
MPV: 11.3 fL (ref 7.5–12.5)
Monocytes Relative: 6.1 %
Neutro Abs: 4795 cells/uL (ref 1500–7800)
Neutrophils Relative %: 60.7 %
Platelets: 235 10*3/uL (ref 140–400)
RBC: 5.34 10*6/uL — ABNORMAL HIGH (ref 3.80–5.10)
RDW: 13.5 % (ref 11.0–15.0)
Total Lymphocyte: 29.6 %
WBC: 7.9 10*3/uL (ref 3.8–10.8)

## 2019-09-16 LAB — COMPLETE METABOLIC PANEL WITH GFR
AG Ratio: 1.7 (calc) (ref 1.0–2.5)
ALT: 12 U/L (ref 6–29)
AST: 14 U/L (ref 10–35)
Albumin: 4.5 g/dL (ref 3.6–5.1)
Alkaline phosphatase (APISO): 91 U/L (ref 37–153)
BUN: 16 mg/dL (ref 7–25)
CO2: 31 mmol/L (ref 20–32)
Calcium: 10.1 mg/dL (ref 8.6–10.4)
Chloride: 103 mmol/L (ref 98–110)
Creat: 1.01 mg/dL (ref 0.50–1.05)
GFR, Est African American: 73 mL/min/{1.73_m2} (ref >=60)
GFR, Est Non African American: 63 mL/min/{1.73_m2} (ref >=60)
Globulin: 2.6 g/dL (calc) (ref 1.9–3.7)
Glucose, Bld: 102 mg/dL — ABNORMAL HIGH (ref 65–99)
Potassium: 4.4 mmol/L (ref 3.5–5.3)
Sodium: 140 mmol/L (ref 135–146)
Total Bilirubin: 0.4 mg/dL (ref 0.2–1.2)
Total Protein: 7.1 g/dL (ref 6.1–8.1)

## 2019-09-16 LAB — HIV ANTIBODY (ROUTINE TESTING W REFLEX): HIV 1&2 Ab, 4th Generation: NONREACTIVE

## 2019-09-16 LAB — LIPID PANEL
Cholesterol: 267 mg/dL — ABNORMAL HIGH (ref <200)
HDL: 52 mg/dL (ref >=50)
LDL Cholesterol (Calc): 188 mg/dL (calc) — ABNORMAL HIGH
Non-HDL Cholesterol (Calc): 215 mg/dL (calc) — ABNORMAL HIGH (ref <130)
Total CHOL/HDL Ratio: 5.1 (calc) — ABNORMAL HIGH (ref <5.0)
Triglycerides: 136 mg/dL (ref <150)

## 2019-09-16 LAB — THYROID PANEL WITH TSH
Free Thyroxine Index: 2 (ref 1.4–3.8)
T3 Uptake: 28 % (ref 22–35)
T4, Total: 7.1 ug/dL (ref 5.1–11.9)
TSH: 1.13 mIU/L

## 2019-09-16 LAB — HEMOGLOBIN A1C
Hgb A1c MFr Bld: 5.6 % of total Hgb (ref <5.7)
Mean Plasma Glucose: 114 (calc)
eAG (mmol/L): 6.3 (calc)

## 2020-01-07 ENCOUNTER — Encounter: Payer: Self-pay | Admitting: Family Medicine

## 2020-01-11 ENCOUNTER — Ambulatory Visit (INDEPENDENT_AMBULATORY_CARE_PROVIDER_SITE_OTHER): Payer: Commercial Managed Care - PPO | Admitting: Family Medicine

## 2020-01-11 ENCOUNTER — Encounter: Payer: Self-pay | Admitting: Family Medicine

## 2020-01-11 DIAGNOSIS — F5104 Psychophysiologic insomnia: Secondary | ICD-10-CM

## 2020-01-11 MED ORDER — TRAZODONE HCL 50 MG PO TABS: 25.0000 mg | ORAL_TABLET | Freq: Every evening | ORAL | 1 refills | Status: DC | PRN

## 2020-01-11 NOTE — Progress Notes (Signed)
Name: Jennifer Oconnell   MRN: [id number]    DOB: 1961-07-04   Date:01/11/2020       Progress Note  Subjective  Chief Complaint  Chief Complaint  Patient presents with  . Medication Refill    She would like to start taking Trazodone for sleep prn. She had started taking some of her daughters and it worked well for her. It has never been prescribed for her, but she is out and would like to get a new prescription.    I connected with  Jennifer Oconnell on 01/11/20 at  8:40 AM EST by telephone and verified that I am speaking with the correct person using two identifiers.  I discussed the limitations, risks, security and privacy concerns of performing an evaluation and management service by telephone and the availability of in person appointments. Staff also discussed with the patient that there may be a patient responsible charge related to this service. Patient Location: at work  Provider Location: [facility]   HPI  Insomnia: she states she has difficulty relaxing at night, mind is busy, difficulty falling and staying asleep, she has been taking Trazodone 25 mg at night and is able to fall and stay asleep. Symptoms started over one year ago . She initially tried Doxylamine but unable to stay asleep. She denies side effects of Trazodone. Explained not to take medications prescribed for other patients. We will give her medication since it worked for her. She states since COVID-19 work has been overwhelming    Patient Active Problem List   Diagnosis Date Noted  . Dysthymia 09/15/2019  . Overweight (BMI 25.0-29.9) 07/29/2018  . Anxiety 07/29/2018  . CIN III (cervical intraepithelial neoplasia III) 03/23/2018  . Atypical glandular cells on cervical Pap smear 08/01/2017  . Cervical high risk HPV (human papillomavirus) test positive 08/01/2017  . Goiter 08/13/2016  . 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 Ulcerative colitis without complications (HCC) 08/13/2016  .  Tobacco use 08/13/2016  . LGSIL of cervix of undetermined significance 08/13/2016  . Dyslipidemia 08/13/2016  . Personal history of other malignant neoplasm of skin 01/03/2014    Past Surgical History:  Procedure Laterality Date  . Cone biopsy   04/2018   clear margins   . MOHS SURGERY  2014   Face    Family History  Problem Relation Age of Onset  . Cancer Mother        colon  . Cancer Father        Lung  . Cancer Brother        Lung    Social History   Tobacco Use  . Smoking status: Current Every Day Smoker    Packs/day: 0.15    Years: 40.00    Pack years: 6.00    Types: Cigarettes    Start date: 08/13/1976  . Smokeless tobacco: Never Used  . Tobacco comment: 3 cigarettes a day, currently  Substance Use Topics  . Alcohol use: Not on file    Current Outpatient Medications:  .  Ascorbic Acid (VITAMIN C) 1000 MG tablet, Take by mouth., Disp: , Rfl:  .  BLACK COHOSH HOT FLASH RELIEF PO, Take by mouth., Disp: , Rfl:  .  cyanocobalamin 100 MCG tablet, Take 1,000 mcg by mouth daily., Disp: , Rfl:  .  Nutritional Supplements (JUICE PLUS FIBRE PO), Take by mouth., Disp: , Rfl:  .  Turmeric (QC TUMERIC COMPLEX PO), Take by mouth., Disp: , Rfl:  .  traZODone (DESYREL)  50 MG tablet, Take 0.5-1 tablets (25-50 mg total) by mouth at bedtime as needed for sleep., Disp: 90 tablet, Rfl: 1  No Known Allergies  I personally reviewed active problem list, medication list, allergies, family history, social history with the patient/caregiver today.   ROS  Ten systems reviewed and is negative except as mentioned in HPI   Objective  Virtual encounter, vitals not obtained.  There is no height or weight on file to calculate BMI.  Physical Exam  Awake, alert and oriented   PHQ2/9: Depression screen [employer] 2/9 01/11/2020 09/15/2019 09/15/2019 07/29/2018 07/28/2017  Decreased Interest 0 0 0 0 0  Down, Depressed, Hopeless 0 1 0 0 0  PHQ - 2 Score 0 1 0 0 0  Altered sleeping 0 0 0 0 -   Tired, decreased energy 0 0 0 0 -  Change in appetite 0 0 0 0 -  Feeling bad or failure about yourself  0 0 0 0 -  Trouble concentrating 0 0 0 0 -  Moving slowly or fidgety/restless 0 0 0 0 -  Suicidal thoughts 0 0 0 0 -  PHQ-9 Score 0 1 0 - -  Difficult doing work/chores - Not difficult at all - Not difficult at all -   PHQ-2/9 Result is negative.    Fall Risk: Fall Risk  01/11/2020 09/15/2019 07/29/2018 07/28/2017 08/13/2016  Falls in the past year? 0 0 No No No  Number falls in past yr: 0 0 - - -  Injury with Fall? 0 0 - - -     Assessment & Plan  1. Psychophysiological insomnia  - traZODone (DESYREL) 50 MG tablet; Take 0.5-1 tablets (25-50 mg total) by mouth at bedtime as needed for sleep.  Dispense: 90 tablet; Refill: 1  I discussed the assessment and treatment plan with the patient. The patient was provided an opportunity to ask questions and all were answered. The patient agreed with the plan and demonstrated an understanding of the instructions.   The patient was advised to call back or seek an in-person evaluation if the symptoms worsen or if the condition fails to improve as anticipated.  I provided 15  minutes of non-face-to-face time during this encounter.  Loistine Chance, MD

## 2020-02-04 ENCOUNTER — Encounter: Payer: Self-pay | Admitting: Family Medicine

## 2020-03-01 ENCOUNTER — Ambulatory Visit: Payer: Commercial Managed Care - PPO | Admitting: Family Medicine

## 2020-06-28 ENCOUNTER — Other Ambulatory Visit: Payer: Self-pay

## 2020-06-28 ENCOUNTER — Ambulatory Visit
Admission: RE | Admit: 2020-06-28 | Discharge: 2020-06-28 | Disposition: A | Discharge status: Home / Self Care | Payer: Commercial Managed Care - PPO | Source: Ambulatory Visit | Attending: Family Medicine | Admitting: Family Medicine

## 2020-06-28 DIAGNOSIS — Z1231 Encounter for screening mammogram for malignant neoplasm of breast: Secondary | ICD-10-CM | POA: Insufficient documentation

## 2020-07-26 ENCOUNTER — Ambulatory Visit: Payer: Commercial Managed Care - PPO | Admitting: Family Medicine

## 2020-08-08 ENCOUNTER — Other Ambulatory Visit: Payer: Self-pay | Admitting: Family Medicine

## 2020-08-08 DIAGNOSIS — F5104 Psychophysiologic insomnia: Secondary | ICD-10-CM

## 2020-08-08 NOTE — Telephone Encounter (Signed)
Requested medication (s) are due for refill today -yes  Requested medication (s) are on the active medication list -yes  Future visit scheduled -yes  Last refill: 04/29/20  Notes to clinic: Call to patient- she wants to know if it is necessary for her to come to office now- she has appointment in January. Told patient would send to PCP for review.  Requested Prescriptions  Pending Prescriptions Disp Refills   traZODone (DESYREL) 50 MG tablet [Pharmacy Med Name: traZODone HCl 50 MG Oral Tablet] 90 tablet 0    Sig: TAKE 1/2 TO 1 (ONE-HALF TO ONE) TABLET BY MOUTH AT BEDTIME AS NEEDED FOR SLEEP      Psychiatry: Antidepressants - Serotonin Modulator Failed - 08/08/2020  9:12 AM      Failed - Valid encounter within last 6 months    Recent Outpatient Visits           7 months ago Psychophysiological insomnia   [facility] Alba Cory, MD   10 months ago Dyslipidemia   [facility] Alba Cory, MD   2 years ago Well woman exam (no gynecological exam)   [facility] Doren Custard, FNP   3 years ago Well woman exam   [facility] Alba Cory, MD   3 years ago Well woman exam   [facility] Alba Cory, MD       Future Appointments             In 4 months Alba Cory, MD [facility]            Passed - Completed PHQ-2 or PHQ-9 in the last 360 days.          Requested Prescriptions  Pending Prescriptions Disp Refills   traZODone (DESYREL) 50 MG tablet [Pharmacy Med Name: traZODone HCl 50 MG Oral Tablet] 90 tablet 0    Sig: TAKE 1/2 TO 1 (ONE-HALF TO ONE) TABLET BY MOUTH AT BEDTIME AS NEEDED FOR SLEEP      Psychiatry: Antidepressants - Serotonin Modulator Failed - 08/08/2020  9:12 AM      Failed - Valid encounter within last 6 months    Recent Outpatient Visits           7 months ago Psychophysiological insomnia   [facility] Alba Cory, MD   10 months ago Dyslipidemia   [facility] Alba Cory, MD   2 years ago Well woman exam (no gynecological exam)   [facility] Doren Custard, FNP   3 years ago Well woman exam   [facility] Alba Cory, MD   3 years ago Well woman exam   [facility] Alba Cory, MD       Future Appointments             In 4 months Alba Cory, MD [facility]            Passed - Completed PHQ-2 or PHQ-9 in the last 360 days.

## 2020-08-15 ENCOUNTER — Other Ambulatory Visit: Payer: Self-pay | Admitting: Family Medicine

## 2020-08-15 DIAGNOSIS — F5104 Psychophysiologic insomnia: Secondary | ICD-10-CM

## 2020-10-26 NOTE — Progress Notes (Signed)
Name: Jennifer Oconnell   MRN: [id number]    DOB: [birth year]   Date:10/27/2020       Progress Note  Subjective  Chief Complaint  Follow up/Medication Refill  HPI   Insomnia: she has been doing well on Trazodone 50 mg at night, she is able to fall and stay asleep. She states also helps her mood. She also feels better since recent weight loss  Dyslipidemia: last LDL 188, she refuses statin therapy, using Le-Vel Thrive patches and avoiding junk food, her weight was 213 lbs in October and is down to 195.0 lbs , she would like to wait until next visit to recheck labs   The 10-year ASCVD risk score Denman George DC Montez Hageman., et al., 2013) is: 7.2%   Values used to calculate the score:     Age: 59 years     Sex: Female     Is Non-Hispanic African American: No     Diabetic: No     Tobacco smoker: Yes     Systolic Blood Pressure: 128 mmHg     Is BP treated: No     HDL Cholesterol: 52 mg/dL     Total Cholesterol: 267 mg/dL  Patient Active Problem List   Diagnosis Date Noted  . Dysthymia 09/15/2019  . Overweight (BMI 25.0-29.9) 07/29/2018  . Anxiety 07/29/2018  . CIN III (cervical intraepithelial neoplasia III) 03/23/2018  . Atypical glandular cells on cervical Pap smear 08/01/2017  . Cervical high risk HPV (human papillomavirus) test positive 08/01/2017  . Goiter 08/13/2016  . 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 Ulcerative colitis without complications (HCC) 08/13/2016  . Tobacco use 08/13/2016  . LGSIL of cervix of undetermined significance 08/13/2016  . Dyslipidemia 08/13/2016  . Personal history of other malignant neoplasm of skin 01/03/2014    Past Surgical History:  Procedure Laterality Date  . Cone biopsy   04/2018   clear margins   . MOHS SURGERY  2014   Face    Family History  Problem Relation Age of Onset  . Cancer Mother        colon  . Cancer Father        Lung  . Cancer Brother        Lung    Social History   Tobacco Use  . Smoking status: Current Every Day  Smoker    Packs/day: 0.15    Years: 40.00    Pack years: 6.00    Types: Cigarettes    Start date: 08/13/1976  . Smokeless tobacco: Never Used  . Tobacco comment: 3 cigarettes a day, currently  Substance Use Topics  . Alcohol use: Not on file     Current Outpatient Medications:  .  Ascorbic Acid (VITAMIN C) 1000 MG tablet, Take by mouth., Disp: , Rfl:  .  Nutritional Supplements (JUICE PLUS FIBRE PO), Take by mouth., Disp: , Rfl:  .  traZODone (DESYREL) 50 MG tablet, TAKE 1/2 TO 1 (ONE-HALF TO ONE) TABLET BY MOUTH AT BEDTIME AS NEEDED FOR SLEEP, Disp: 90 tablet, Rfl: 0 .  Turmeric (QC TUMERIC COMPLEX PO), Take by mouth., Disp: , Rfl:  .  BLACK COHOSH HOT FLASH RELIEF PO, Take by mouth. (Patient not taking: Reported on 10/27/2020), Disp: , Rfl:  .  cyanocobalamin 100 MCG tablet, Take 1,000 mcg by mouth daily. (Patient not taking: Reported on 10/27/2020), Disp: , Rfl:   No Known Allergies  I personally reviewed active problem list, medication list, allergies, family history, social history, health maintenance with  the patient/caregiver today.   ROS  Ten systems reviewed and is negative except as mentioned in HPI   Objective  Vitals:   10/27/20 1538  BP: 128/86  Pulse: 75  Resp: 18  Temp: 98.5 F (36.9 C)  TempSrc: Oral  SpO2: 98%  Weight: 195 lb (88.5 kg)  Height: 5\' 7"  (1.702 m)    Body mass index is 30.54 kg/m.  Physical Exam  Constitutional: Patient appears well-developed and well-nourished. Obese No distress.  HEENT: head atraumatic, normocephalic, pupils equal and reactive to light,  neck supple Cardiovascular: Normal rate, regular rhythm and normal heart sounds.  No murmur heard. No BLE edema. Pulmonary/Chest: Effort normal and breath sounds normal. No respiratory distress. Abdominal: Soft.  There is no tenderness. Psychiatric: Patient has a normal mood and affect. behavior is normal. Judgment and thought content normal.  PHQ2/9: Depression screen [facility] 2/9  10/27/2020 01/11/2020 09/15/2019 09/15/2019 07/29/2018  Decreased Interest 0 0 0 0 0  Down, Depressed, Hopeless 0 0 1 0 0  PHQ - 2 Score 0 0 1 0 0  Altered sleeping - 0 0 0 0  Tired, decreased energy - 0 0 0 0  Change in appetite - 0 0 0 0  Feeling bad or failure about yourself  - 0 0 0 0  Trouble concentrating - 0 0 0 0  Moving slowly or fidgety/restless - 0 0 0 0  Suicidal thoughts - 0 0 0 0  PHQ-9 Score - 0 1 0 -  Difficult doing work/chores - - Not difficult at all - Not difficult at all    phq 9 is negative   Fall Risk: Fall Risk  10/27/2020 01/11/2020 09/15/2019 07/29/2018 07/28/2017  Falls in the past year? 0 0 0 No No  Number falls in past yr: 0 0 0 - -  Injury with Fall? 0 0 0 - -     Functional Status Survey: Is the patient deaf or have difficulty hearing?: No Does the patient have difficulty seeing, even when wearing glasses/contacts?: No Does the patient have difficulty concentrating, remembering, or making decisions?: No Does the patient have difficulty walking or climbing stairs?: No Does the patient have difficulty dressing or bathing?: No Does the patient have difficulty doing errands alone such as visiting a doctor's office or shopping?: No    Assessment & Plan  1. Psychophysiological insomnia  - traZODone (DESYREL) 50 MG tablet; Take 1 tablet (50 mg total) by mouth at bedtime.  Dispense: 90 tablet; Refill: 3  2. Dyslipidemia  On life style modification

## 2020-10-27 ENCOUNTER — Encounter: Payer: Self-pay | Admitting: Family Medicine

## 2020-10-27 ENCOUNTER — Ambulatory Visit: Payer: Commercial Managed Care - PPO | Admitting: Family Medicine

## 2020-10-27 ENCOUNTER — Other Ambulatory Visit: Payer: Self-pay

## 2020-10-27 VITALS — BP 128/86 | HR 75 | Temp 98.5°F | Resp 18 | Ht 67.0 in | Wt 195.0 lb

## 2020-10-27 DIAGNOSIS — E785 Hyperlipidemia, unspecified: Secondary | ICD-10-CM

## 2020-10-27 DIAGNOSIS — F5104 Psychophysiologic insomnia: Secondary | ICD-10-CM | POA: Diagnosis not present

## 2020-10-27 MED ORDER — TRAZODONE HCL 50 MG PO TABS: 50.0000 mg | ORAL_TABLET | Freq: Every day | ORAL | 3 refills | Status: DC

## 2020-11-20 ENCOUNTER — Other Ambulatory Visit: Payer: Self-pay | Admitting: Family Medicine

## 2020-11-20 DIAGNOSIS — F5104 Psychophysiologic insomnia: Secondary | ICD-10-CM

## 2020-12-06 ENCOUNTER — Other Ambulatory Visit: Payer: Self-pay

## 2020-12-06 ENCOUNTER — Encounter: Payer: Self-pay | Admitting: Family Medicine

## 2020-12-06 ENCOUNTER — Ambulatory Visit (INDEPENDENT_AMBULATORY_CARE_PROVIDER_SITE_OTHER): Payer: Commercial Managed Care - PPO | Admitting: Family Medicine

## 2020-12-06 ENCOUNTER — Other Ambulatory Visit (HOSPITAL_COMMUNITY)
Admission: RE | Admit: 2020-12-06 | Discharge: 2020-12-06 | Disposition: A | Discharge status: Home / Self Care | Payer: Commercial Managed Care - PPO | Source: Ambulatory Visit | Attending: Family Medicine | Admitting: Family Medicine

## 2020-12-06 VITALS — BP 136/88 | HR 90 | Temp 98.1°F | Resp 16 | Ht 67.0 in | Wt 195.8 lb

## 2020-12-06 DIAGNOSIS — Z131 Encounter for screening for diabetes mellitus: Secondary | ICD-10-CM

## 2020-12-06 DIAGNOSIS — E785 Hyperlipidemia, unspecified: Secondary | ICD-10-CM | POA: Diagnosis not present

## 2020-12-06 DIAGNOSIS — Z124 Encounter for screening for malignant neoplasm of cervix: Secondary | ICD-10-CM | POA: Insufficient documentation

## 2020-12-06 DIAGNOSIS — Z1231 Encounter for screening mammogram for malignant neoplasm of breast: Secondary | ICD-10-CM

## 2020-12-06 DIAGNOSIS — Z23 Encounter for immunization: Secondary | ICD-10-CM

## 2020-12-06 DIAGNOSIS — Z0000 Encounter for general adult medical examination without abnormal findings: Secondary | ICD-10-CM

## 2020-12-06 DIAGNOSIS — E049 Nontoxic goiter, unspecified: Secondary | ICD-10-CM | POA: Diagnosis not present

## 2020-12-06 DIAGNOSIS — Z1211 Encounter for screening for malignant neoplasm of colon: Secondary | ICD-10-CM

## 2020-12-06 NOTE — Progress Notes (Signed)
Name: Jennifer Oconnell   MRN: [id number]    DOB: 1961-09-17   Date:12/06/2020       Progress Note  Subjective  Chief Complaint  Annual Exam  HPI  Patient presents for annual CPE.  Diet: balanced diet  Exercise: she has been walking 3-4 k steps per day   Nathalie Visit from 10/27/2020 in [facility]  AUDIT-C Score 0     Depression: Phq 9 is  Positive - tired of covid  Depression screen [facility] 2/9 12/06/2020 10/27/2020 01/11/2020 09/15/2019 09/15/2019  Decreased Interest 0 0 0 0 0  Down, Depressed, Hopeless 1 0 0 1 0  PHQ - 2 Score 1 0 0 1 0  Altered sleeping 1 - 0 0 0  Tired, decreased energy 1 - 0 0 0  Change in appetite 0 - 0 0 0  Feeling bad or failure about yourself  1 - 0 0 0  Trouble concentrating 0 - 0 0 0  Moving slowly or fidgety/restless 0 - 0 0 0  Suicidal thoughts 0 - 0 0 0  PHQ-9 Score 4 - 0 1 0  Difficult doing work/chores Not difficult at all - - Not difficult at all -   Hypertension: BP Readings from Last 3 Encounters:  12/06/20 136/88  10/27/20 128/86  09/15/19 136/90   Obesity: Wt Readings from Last 3 Encounters:  12/06/20 195 lb 12.8 oz (88.8 kg)  10/27/20 195 lb (88.5 kg)  09/15/19 203 lb (92.1 kg)   BMI Readings from Last 3 Encounters:  12/06/20 30.67 kg/m  10/27/20 30.54 kg/m  09/15/19 31.79 kg/m     Vaccines:   Shingrix: 48-64 yo and ask insurance if covered when patient above 51 yo. Not interested  Pneumonia:  educated and discussed with patient. Flu:  educated and discussed with patient.  Hep C Screening: 04/05/13 STD testing and prevention (HIV/chl/gon/syphilis): 09/15/19 Intimate partner violence: negative Sexual History : one sexual partner, not interested in checking STI, no pain during intercourse  Menstrual History/LMP/Abnormal Bleeding: discussed post-menopausal bleeding  Incontinence Symptoms: no problems   Breast cancer:  - Last Mammogram: 06/28/20 - BRCA gene screening:  N/A  Osteoporosis: Discussed high calcium and vitamin D supplementation, weight bearing exercises  Cervical cancer screening: Order placed today  Skin cancer: Discussed monitoring for atypical lesions  Colorectal cancer:   Repeat in 2022  Lung cancer:   Low Dose CT Chest recommended if Age 77-80 years, 20 pack-year currently smoking OR have quit w/in 15years. Patient does not qualify.   ECG: 09/15/19  Advanced Care Planning: A voluntary discussion about advance care planning including the explanation and discussion of advance directives.  Discussed health care proxy and Living will, and the patient was able to identify a health care proxy as husband .  Patient does not have a living will at present time. If patient does have living will, I have requested they bring this to the clinic to be scanned in to their chart.  Lipids: Lab Results  Component Value Date   CHOL 267 (H) 09/15/2019   CHOL 249 (H) 07/29/2018   CHOL 219 (H) 07/29/2017   Lab Results  Component Value Date   HDL 52 09/15/2019   HDL 52 07/29/2018   HDL 49 (L) 07/29/2017   Lab Results  Component Value Date   LDLCALC 188 (H) 09/15/2019   LDLCALC 171 (H) 07/29/2018   LDLCALC 148 (H) 07/29/2017   Lab Results  Component Value Date   TRIG 136  09/15/2019   TRIG 126 07/29/2018   TRIG 103 07/29/2017   Lab Results  Component Value Date   CHOLHDL 5.1 (H) 09/15/2019   CHOLHDL 4.8 07/29/2018   CHOLHDL 4.5 07/29/2017   No results found for: LDLDIRECT  Glucose: Glucose, Bld  Date Value Ref Range Status  09/15/2019 102 (H) 65 - 99 mg/dL Final    Comment:    .            Fasting reference interval . For someone without known diabetes, a glucose value between 100 and 125 mg/dL is consistent with prediabetes and should be confirmed with a follow-up test. .   07/29/2018 86 65 - 139 mg/dL Final    Comment:    .        Non-fasting reference interval .   07/29/2017 106 (H) 65 - 99 mg/dL Final    Comment:     .            Fasting reference interval . For someone without known diabetes, a glucose value between 100 and 125 mg/dL is consistent with prediabetes and should be confirmed with a follow-up test. .     Patient Active Problem List   Diagnosis Date Noted  . Dysthymia 09/15/2019  . Overweight (BMI 25.0-29.9) 07/29/2018  . Anxiety 07/29/2018  . CIN III (cervical intraepithelial neoplasia III) 03/23/2018  . Atypical glandular cells on cervical Pap smear 08/01/2017  . Cervical high risk HPV (human papillomavirus) test positive 08/01/2017  . Goiter 08/13/2016  . 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 Ulcerative colitis without complications ([place]) 54/62/7035  . Tobacco use 08/13/2016  . LGSIL of cervix of undetermined significance 08/13/2016  . Dyslipidemia 08/13/2016  . Personal history of other malignant neoplasm of skin 01/03/2014    Past Surgical History:  Procedure Laterality Date  . Cone biopsy   04/2018   clear margins   . MOHS SURGERY  2014   Face    Family History  Problem Relation Age of Onset  . Cancer Mother        colon  . Cancer Father        Lung  . Cancer Brother        Lung    Social History   Socioeconomic History  . Marital status: Married    Spouse name: Magda Paganini  . Number of children: 51  . Years of education: Not on file  . Highest education level: Associate degree: occupational, Hotel manager, or vocational program  Occupational History  . Occupation: Stage manager: [employer]  Tobacco Use  . Smoking status: Current Every Day Smoker    Packs/day: 0.15    Years: 40.00    Pack years: 6.00    Types: Cigarettes    Start date: 08/13/1976  . Smokeless tobacco: Never Used  . Tobacco comment: 3 cigarettes a day, currently  Vaping Use  . Vaping Use: Never used  Substance and Sexual Activity  . Alcohol use: Not on file  . Drug use: No  . Sexual activity: Yes    Partners: Male    Birth control/protection: None  Other  Topics Concern  . Not on file  Social History Narrative   Married, 8 biological children, 2 step-children and 7 adopted children.    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 Difficulty of Paying Living Expenses: Not hard at all  Food Insecurity: No Food Insecurity  . Worried About Charity fundraiser in the Last Year: Never true  . Ran Out of Food in the Last Year: Never true  Transportation Needs: No Transportation Needs  . Lack of Transportation (Medical): No  . Lack of Transportation (Non-Medical): No  Physical Activity: Sufficiently Active  . Days of Exercise per Week: 7 days  . Minutes of Exercise per Session: 30 min  Stress: Stress Concern Present  . Feeling of Stress : To some extent  Social Connections: Moderately Integrated  . Frequency of Communication with Friends and Family: More than three times a week  . Frequency of Social Gatherings with Friends and Family: Once a week  . Attends Religious Services: 1 to 4 times per year  . Active Member of Clubs or Organizations: No  . Attends Archivist Meetings: Never  . Marital Status: Married  Human resources officer Violence: Not At Risk  . Fear of Current or Ex-Partner: No  . Emotionally Abused: No  . Physically Abused: No  . Sexually Abused: No     Current Outpatient Medications:  .  Ascorbic Acid (VITAMIN C) 1000 MG tablet, Take by mouth., Disp: , Rfl:  .  aspirin 81 MG chewable tablet, Chew by mouth daily., Disp: , Rfl:  .  B Complex Vitamins (VITAMIN B COMPLEX PO), Take by mouth., Disp: , Rfl:  .  Biotin 10000 MCG TABS, Take by mouth., Disp: , Rfl:  .  Nutritional Supplements (JUICE PLUS FIBRE PO), Take by mouth., Disp: , Rfl:  .  traZODone (DESYREL) 50 MG tablet, Take 1 tablet (50 mg total) by mouth at bedtime., Disp: 90 tablet, Rfl:  3 .  Turmeric (QC TUMERIC COMPLEX PO), Take by mouth., Disp: , Rfl:   No Known Allergies   ROS  Constitutional: Negative for fever or weight change.  Respiratory: Negative for cough and shortness of breath.   Cardiovascular: Negative for chest pain or palpitations.  Gastrointestinal: Negative for abdominal pain, no bowel changes.  Musculoskeletal: Negative for gait problem or joint swelling.  Skin: Negative for rash.  Neurological: Negative for dizziness or headache.  No other specific complaints in a complete review of systems (except as listed in HPI above).  Objective  Vitals:   12/06/20 1507  BP: 136/88  Pulse: 90  Resp: 16  Temp: 98.1 F (36.7 C)  TempSrc: Oral  SpO2: 98%  Weight: 195 lb 12.8 oz (88.8 kg)  Height: 5' 7" (1.702 m)    Body mass index is 30.67 kg/m.  Physical Exam  Constitutional: Patient appears well-developed and well-nourished. No distress.  HENT: Head: Normocephalic and atraumatic. Ears: B TMs ok, no erythema or effusion; Nose: not done  Mouth/Throat: not done Eyes: Conjunctivae and EOM are normal. Pupils are equal, round, and reactive to light. No scleral icterus.  Neck: Normal range of motion. Neck supple. No JVD present. No thyromegaly present.  Cardiovascular: Normal rate, regular rhythm and normal heart sounds.  No murmur heard. No BLE edema. Pulmonary/Chest: Effort normal and breath sounds normal. No respiratory distress. Abdominal: Soft. Bowel sounds are normal, no distension. There is no tenderness. no masses Breast: no lumps or masses, no nipple discharge or rashes FEMALE GENITALIA:  External genitalia normal External urethra normal Vaginal vault normal without discharge or lesions Cervix normal without discharge or lesions Bimanual exam normal  without masses RECTAL: not done  Musculoskeletal: Normal range of motion, no joint effusions. No gross deformities Neurological: he is alert and oriented to person, place, and time. No  cranial nerve deficit. Coordination, balance, strength, speech and gait are normal.  Skin: Skin is warm and dry. No rash noted. No erythema.  Psychiatric: Patient has a normal mood and affect. behavior is normal. Judgment and thought content normal.  Fall Risk: Fall Risk  12/06/2020 10/27/2020 01/11/2020 09/15/2019 07/29/2018  Falls in the past year? - 0 0 0 No  Number falls in past yr: 0 0 0 0 -  Injury with Fall? 0 0 0 0 -    Assessment & Plan  1. Well adult exam  - CBC with Differential/Platelet - COMPLETE METABOLIC PANEL WITH GFR  2. Cervical cancer screening  - Cytology - PAP  3. Goiter  - TSH  4. Dyslipidemia  - Lipid panel  5. Diabetes mellitus screening  - Hemoglobin A1c  6. Encounter for screening mammogram for malignant neoplasm of breast  - MM 3D SCREEN BREAST BILATERAL; Future  7. Needs flu shot  Had it work   8. Colon cancer screening  Refused referral   9. Need for shingles vaccine  Refused   -USPSTF grade A and B recommendations reviewed with patient; age-appropriate recommendations, preventive care, screening tests, etc discussed and encouraged; healthy living encouraged; see AVS for patient education given to patient -Discussed importance of 150 minutes of physical activity weekly, eat two servings of fish weekly, eat one serving of tree nuts ( cashews, pistachios, pecans, almonds.Marland Kitchen) every other day, eat 6 servings of fruit/vegetables daily and drink plenty of water and avoid sweet beverages.

## 2020-12-06 NOTE — Patient Instructions (Signed)
Preventive Care 84-58 Years Old, Female Preventive care refers to lifestyle choices and visits with your health care provider that can promote health and wellness. This includes:  A yearly physical exam. This is also called an annual wellness visit.  Regular dental and eye exams.  Immunizations.  Screening for certain conditions.  Healthy lifestyle choices, such as: ? Eating a healthy diet. ? Getting regular exercise. ? Not using drugs or products that contain nicotine and tobacco. ? Limiting alcohol use. What can I expect for my preventive care visit? Physical exam Your health care provider will check your:  Height and weight. These may be used to calculate your BMI (body mass index). BMI is a measurement that tells if you are at a healthy weight.  Heart rate and blood pressure.  Body temperature.  Skin for abnormal spots. Counseling Your health care provider may ask you questions about your:  Past medical problems.  Family's medical history.  Alcohol, tobacco, and drug use.  Emotional well-being.  Home life and relationship well-being.  Sexual activity.  Diet, exercise, and sleep habits.  Work and work Statistician.  Access to firearms.  Method of birth control.  Menstrual cycle.  Pregnancy history. What immunizations do I need? Vaccines are usually given at various ages, according to a schedule. Your health care provider will recommend vaccines for you based on your age, medical history, and lifestyle or other factors, such as travel or where you work.   What tests do I need? Blood tests  Lipid and cholesterol levels. These may be checked every 5 years, or more often if you are over 3 years old.  Hepatitis C test.  Hepatitis B test. Screening  Lung cancer screening. You may have this screening every year starting at age 73 if you have a 30-pack-year history of smoking and currently smoke or have quit within the past 15 years.  Colorectal cancer  screening. ? All adults should have this screening starting at age 52 and continuing until age 17. ? Your health care provider may recommend screening at age 49 if you are at increased risk. ? You will have tests every 1-10 years, depending on your results and the type of screening test.  Diabetes screening. ? This is done by checking your blood sugar (glucose) after you have not eaten for a while (fasting). ? You may have this done every 1-3 years.  Mammogram. ? This may be done every 1-2 years. ? Talk with your health care provider about when you should start having regular mammograms. This may depend on whether you have a family history of breast cancer.  BRCA-related cancer screening. This may be done if you have a family history of breast, ovarian, tubal, or peritoneal cancers.  Pelvic exam and Pap test. ? This may be done every 3 years starting at age 10. ? Starting at age 11, this may be done every 5 years if you have a Pap test in combination with an HPV test. Other tests  STD (sexually transmitted disease) testing, if you are at risk.  Bone density scan. This is done to screen for osteoporosis. You may have this scan if you are at high risk for osteoporosis. Talk with your health care provider about your test results, treatment options, and if necessary, the need for more tests. Follow these instructions at home: Eating and drinking  Eat a diet that includes fresh fruits and vegetables, whole grains, lean protein, and low-fat dairy products.  Take vitamin and mineral supplements  as recommended by your health care provider.  Do not drink alcohol if: ? Your health care provider tells you not to drink. ? You are pregnant, may be pregnant, or are planning to become pregnant.  If you drink alcohol: ? Limit how much you have to 0-1 drink a day. ? Be aware of how much alcohol is in your drink. In the [place] one drink equals one 12 oz bottle of beer (355 mL), one 5 oz glass of  wine (148 mL), or one 1 oz glass of hard liquor (44 mL).   Lifestyle  Take daily care of your teeth and gums. Brush your teeth every morning and night with fluoride toothpaste. Floss one time each day.  Stay active. Exercise for at least 30 minutes 5 or more days each week.  Do not use any products that contain nicotine or tobacco, such as cigarettes, e-cigarettes, and chewing tobacco. If you need help quitting, ask your health care provider.  Do not use drugs.  If you are sexually active, practice safe sex. Use a condom or other form of protection to prevent STIs (sexually transmitted infections).  If you do not wish to become pregnant, use a form of birth control. If you plan to become pregnant, see your health care provider for a prepregnancy visit.  If told by your health care provider, take low-dose aspirin daily starting at age 50.  Find healthy ways to cope with stress, such as: ? Meditation, yoga, or listening to music. ? Journaling. ? Talking to a trusted person. ? Spending time with friends and family. Safety  Always wear your seat belt while driving or riding in a vehicle.  Do not drive: ? If you have been drinking alcohol. Do not ride with someone who has been drinking. ? When you are tired or distracted. ? While texting.  Wear a helmet and other protective equipment during sports activities.  If you have firearms in your house, make sure you follow all gun safety procedures. What's next?  Visit your health care provider once a year for an annual wellness visit.  Ask your health care provider how often you should have your eyes and teeth checked.  Stay up to date on all vaccines. This information is not intended to replace advice given to you by your health care provider. Make sure you discuss any questions you have with your health care provider. Document Revised: 08/08/2020 Document Reviewed: 07/16/2018 Elsevier Patient Education  2021 Elsevier Inc.  

## 2020-12-07 LAB — COMPLETE METABOLIC PANEL WITH GFR
AG Ratio: 1.6 (calc) (ref 1.0–2.5)
ALT: 21 U/L (ref 6–29)
AST: 20 U/L (ref 10–35)
Albumin: 4.8 g/dL (ref 3.6–5.1)
Alkaline phosphatase (APISO): 112 U/L (ref 37–153)
BUN: 13 mg/dL (ref 7–25)
CO2: 28 mmol/L (ref 20–32)
Calcium: 10 mg/dL (ref 8.6–10.4)
Chloride: 104 mmol/L (ref 98–110)
Creat: 0.87 mg/dL (ref 0.50–1.05)
GFR, Est African American: 86 mL/min/{1.73_m2} (ref >=60)
GFR, Est Non African American: 74 mL/min/{1.73_m2} (ref >=60)
Globulin: 3 g/dL (calc) (ref 1.9–3.7)
Glucose, Bld: 83 mg/dL (ref 65–99)
Potassium: 4 mmol/L (ref 3.5–5.3)
Sodium: 141 mmol/L (ref 135–146)
Total Bilirubin: 0.6 mg/dL (ref 0.2–1.2)
Total Protein: 7.8 g/dL (ref 6.1–8.1)

## 2020-12-07 LAB — TSH: TSH: 1.32 mIU/L (ref 0.40–4.50)

## 2020-12-07 LAB — CBC WITH DIFFERENTIAL/PLATELET
Absolute Monocytes: 546 cells/uL (ref 200–950)
Basophils Absolute: 50 cells/uL (ref 0–200)
Basophils Relative: 0.6 %
Eosinophils Absolute: 168 cells/uL (ref 15–500)
Eosinophils Relative: 2 %
HCT: 46.2 % — ABNORMAL HIGH (ref 35.0–45.0)
Hemoglobin: 15.8 g/dL — ABNORMAL HIGH (ref 11.7–15.5)
Lymphs Abs: 2545 cells/uL (ref 850–3900)
MCH: 28.9 pg (ref 27.0–33.0)
MCHC: 34.2 g/dL (ref 32.0–36.0)
MCV: 84.6 fL (ref 80.0–100.0)
MPV: 11.4 fL (ref 7.5–12.5)
Monocytes Relative: 6.5 %
Neutro Abs: 5090 cells/uL (ref 1500–7800)
Neutrophils Relative %: 60.6 %
Platelets: 235 10*3/uL (ref 140–400)
RBC: 5.46 10*6/uL — ABNORMAL HIGH (ref 3.80–5.10)
RDW: 13 % (ref 11.0–15.0)
Total Lymphocyte: 30.3 %
WBC: 8.4 10*3/uL (ref 3.8–10.8)

## 2020-12-07 LAB — LIPID PANEL
Cholesterol: 243 mg/dL — ABNORMAL HIGH (ref <200)
HDL: 70 mg/dL (ref >=50)
LDL Cholesterol (Calc): 147 mg/dL (calc) — ABNORMAL HIGH
Non-HDL Cholesterol (Calc): 173 mg/dL (calc) — ABNORMAL HIGH (ref <130)
Total CHOL/HDL Ratio: 3.5 (calc) (ref <5.0)
Triglycerides: 136 mg/dL (ref <150)

## 2020-12-07 LAB — HEMOGLOBIN A1C
Hgb A1c MFr Bld: 5.6 % of total Hgb (ref <5.7)
Mean Plasma Glucose: 114 mg/dL
eAG (mmol/L): 6.3 mmol/L

## 2020-12-08 ENCOUNTER — Encounter: Payer: Self-pay | Admitting: Family Medicine

## 2020-12-11 LAB — CYTOLOGY - PAP
Adequacy: ABSENT
Comment: NEGATIVE
Diagnosis: NEGATIVE
High risk HPV: NEGATIVE

## 2020-12-12 ENCOUNTER — Other Ambulatory Visit: Payer: Self-pay | Admitting: Family Medicine

## 2020-12-12 ENCOUNTER — Encounter: Payer: Self-pay | Admitting: Family Medicine

## 2020-12-12 DIAGNOSIS — Z720 Tobacco use: Secondary | ICD-10-CM

## 2020-12-12 DIAGNOSIS — R0602 Shortness of breath: Secondary | ICD-10-CM

## 2020-12-25 ENCOUNTER — Other Ambulatory Visit: Payer: Self-pay

## 2020-12-25 ENCOUNTER — Ambulatory Visit
Admission: RE | Admit: 2020-12-25 | Discharge: 2020-12-25 | Disposition: A | Discharge status: Home / Self Care | Payer: Commercial Managed Care - PPO | Source: Ambulatory Visit | Attending: Family Medicine | Admitting: Family Medicine

## 2020-12-25 DIAGNOSIS — Z720 Tobacco use: Secondary | ICD-10-CM | POA: Insufficient documentation

## 2020-12-25 DIAGNOSIS — R0602 Shortness of breath: Secondary | ICD-10-CM | POA: Diagnosis not present

## 2020-12-26 ENCOUNTER — Encounter: Payer: Self-pay | Admitting: Family Medicine

## 2020-12-26 DIAGNOSIS — I700 Atherosclerosis of aorta: Secondary | ICD-10-CM | POA: Insufficient documentation

## 2021-11-18 ENCOUNTER — Other Ambulatory Visit: Payer: Self-pay | Admitting: Family Medicine

## 2021-11-18 DIAGNOSIS — F5104 Psychophysiologic insomnia: Secondary | ICD-10-CM

## 2021-11-21 ENCOUNTER — Other Ambulatory Visit: Payer: Self-pay | Admitting: Family Medicine

## 2021-11-21 DIAGNOSIS — Z1231 Encounter for screening mammogram for malignant neoplasm of breast: Secondary | ICD-10-CM

## 2021-11-28 ENCOUNTER — Ambulatory Visit
Admission: RE | Admit: 2021-11-28 | Discharge: 2021-11-28 | Disposition: A | Discharge status: Home / Self Care | Payer: Commercial Managed Care - PPO | Source: Ambulatory Visit | Attending: Family Medicine | Admitting: Family Medicine

## 2021-11-28 ENCOUNTER — Other Ambulatory Visit: Payer: Self-pay

## 2021-11-28 DIAGNOSIS — Z1231 Encounter for screening mammogram for malignant neoplasm of breast: Secondary | ICD-10-CM | POA: Insufficient documentation

## 2022-01-01 ENCOUNTER — Ambulatory Visit (INDEPENDENT_AMBULATORY_CARE_PROVIDER_SITE_OTHER): Payer: Commercial Managed Care - PPO | Admitting: Family Medicine

## 2022-01-01 ENCOUNTER — Other Ambulatory Visit: Payer: Self-pay

## 2022-01-01 ENCOUNTER — Encounter: Payer: Self-pay | Admitting: Family Medicine

## 2022-01-01 VITALS — BP 134/76 | HR 95 | Temp 98.2°F | Resp 16 | Ht 68.0 in | Wt 205.0 lb

## 2022-01-01 DIAGNOSIS — Z0000 Encounter for general adult medical examination without abnormal findings: Secondary | ICD-10-CM | POA: Diagnosis not present

## 2022-01-01 DIAGNOSIS — Z1211 Encounter for screening for malignant neoplasm of colon: Secondary | ICD-10-CM

## 2022-01-01 DIAGNOSIS — E785 Hyperlipidemia, unspecified: Secondary | ICD-10-CM

## 2022-01-01 DIAGNOSIS — F5104 Psychophysiologic insomnia: Secondary | ICD-10-CM | POA: Diagnosis not present

## 2022-01-01 DIAGNOSIS — E049 Nontoxic goiter, unspecified: Secondary | ICD-10-CM | POA: Diagnosis not present

## 2022-01-01 DIAGNOSIS — Z720 Tobacco use: Secondary | ICD-10-CM

## 2022-01-01 DIAGNOSIS — Z131 Encounter for screening for diabetes mellitus: Secondary | ICD-10-CM | POA: Diagnosis not present

## 2022-01-01 MED ORDER — TRAZODONE HCL 50 MG PO TABS: 50.0000 mg | ORAL_TABLET | Freq: Every day | ORAL | 3 refills | Status: DC
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
## 2022-01-01 NOTE — Patient Instructions (Signed)
Preventive Care 61-59 Years Old, Female Preventive care refers to lifestyle choices and visits with your health care provider that can promote health and wellness. Preventive care visits are also called wellness exams. What can I expect for my preventive care visit? Counseling Your health care provider may ask you questions about your: Medical history, including: Past medical problems. Family medical history. Pregnancy history. Current health, including: Menstrual cycle. Method of birth control. Emotional well-being. Home life and relationship well-being. Sexual activity and sexual health. Lifestyle, including: Alcohol, nicotine or tobacco, and drug use. Access to firearms. Diet, exercise, and sleep habits. Work and work Statistician. Sunscreen use. Safety issues such as seatbelt and bike helmet use. Physical exam Your health care provider will check your: Height and weight. These may be used to calculate your BMI (body mass index). BMI is a measurement that tells if you are at a healthy weight. Waist circumference. This measures the distance around your waistline. This measurement also tells if you are at a healthy weight and may help predict your risk of certain diseases, such as type 2 diabetes and high blood pressure. Heart rate and blood pressure. Body temperature. Skin for abnormal spots. What immunizations do I need? Vaccines are usually given at various ages, according to a schedule. Your health care provider will recommend vaccines for you based on your age, medical history, and lifestyle or other factors, such as travel or where you work. What tests do I need? Screening Your health care provider may recommend screening tests for certain conditions. This may include: Lipid and cholesterol levels. Diabetes screening. This is done by checking your blood sugar (glucose) after you have not eaten for a while (fasting). Pelvic exam and Pap test. Hepatitis B test. Hepatitis C  test. HIV (human immunodeficiency virus) test. STI (sexually transmitted infection) testing, if you are at risk. Lung cancer screening. Colorectal cancer screening. Mammogram. Talk with your health care provider about when you should start having regular mammograms. This may depend on whether you have a family history of breast cancer. BRCA-related cancer screening. This may be done if you have a family history of breast, ovarian, tubal, or peritoneal cancers. Bone density scan. This is done to screen for osteoporosis. Talk with your health care provider about your test results, treatment options, and if necessary, the need for more tests. Follow these instructions at home: Eating and drinking  Eat a diet that includes fresh fruits and vegetables, whole grains, lean protein, and low-fat dairy products. Take vitamin and mineral supplements as recommended by your health care provider. Do not drink alcohol if: Your health care provider tells you not to drink. You are pregnant, may be pregnant, or are planning to become pregnant. If you drink alcohol: Limit how much you have to 0-1 drink a day. Know how much alcohol is in your drink. In the [place] one drink equals one 12 oz bottle of beer (355 mL), one 5 oz glass of wine (148 mL), or one 1 oz glass of hard liquor (44 mL). Lifestyle Brush your teeth every morning and night with fluoride toothpaste. Floss one time each day. Exercise for at least 30 minutes 5 or more days each week. Do not use any products that contain nicotine or tobacco. These products include cigarettes, chewing tobacco, and vaping devices, such as e-cigarettes. If you need help quitting, ask your health care provider. Do not use drugs. If you are sexually active, practice safe sex. Use a condom or other form of protection to prevent  STIs. If you do not wish to become pregnant, use a form of birth control. If you plan to become pregnant, see your health care provider for a  prepregnancy visit. Take aspirin only as told by your health care provider. Make sure that you understand how much to take and what form to take. Work with your health care provider to find out whether it is safe and beneficial for you to take aspirin daily. Find healthy ways to manage stress, such as: Meditation, yoga, or listening to music. Journaling. Talking to a trusted person. Spending time with friends and family. Minimize exposure to UV radiation to reduce your risk of skin cancer. Safety Always wear your seat belt while driving or riding in a vehicle. Do not drive: If you have been drinking alcohol. Do not ride with someone who has been drinking. When you are tired or distracted. While texting. If you have been using any mind-altering substances or drugs. Wear a helmet and other protective equipment during sports activities. If you have firearms in your house, make sure you follow all gun safety procedures. Seek help if you have been physically or sexually abused. What's next? Visit your health care provider once a year for an annual wellness visit. Ask your health care provider how often you should have your eyes and teeth checked. Stay up to date on all vaccines. This information is not intended to replace advice given to you by your health care provider. Make sure you discuss any questions you have with your health care provider. Document Revised: 05/02/2021 Document Reviewed: 05/02/2021 Elsevier Patient Education  [place].

## 2022-01-02 ENCOUNTER — Encounter: Payer: Self-pay | Admitting: Family Medicine

## 2022-01-02 LAB — CBC WITH DIFFERENTIAL/PLATELET
Absolute Monocytes: 644 cells/uL (ref 200–950)
Basophils Absolute: 69 cells/uL (ref 0–200)
Basophils Relative: 0.7 %
Eosinophils Absolute: 366 cells/uL (ref 15–500)
Eosinophils Relative: 3.7 %
HCT: 43.5 % (ref 35.0–45.0)
Hemoglobin: 14.7 g/dL (ref 11.7–15.5)
Lymphs Abs: 3455 cells/uL (ref 850–3900)
MCH: 28.8 pg (ref 27.0–33.0)
MCHC: 33.8 g/dL (ref 32.0–36.0)
MCV: 85.3 fL (ref 80.0–100.0)
MPV: 11 fL (ref 7.5–12.5)
Monocytes Relative: 6.5 %
Neutro Abs: 5366 cells/uL (ref 1500–7800)
Neutrophils Relative %: 54.2 %
Platelets: 227 10*3/uL (ref 140–400)
RBC: 5.1 10*6/uL (ref 3.80–5.10)
RDW: 13 % (ref 11.0–15.0)
Total Lymphocyte: 34.9 %
WBC: 9.9 10*3/uL (ref 3.8–10.8)

## 2022-01-02 LAB — COMPLETE METABOLIC PANEL WITH GFR
AG Ratio: 1.5 (calc) (ref 1.0–2.5)
ALT: 15 U/L (ref 6–29)
AST: 17 U/L (ref 10–35)
Albumin: 4.4 g/dL (ref 3.6–5.1)
Alkaline phosphatase (APISO): 108 U/L (ref 37–153)
BUN: 12 mg/dL (ref 7–25)
CO2: 28 mmol/L (ref 20–32)
Calcium: 9.6 mg/dL (ref 8.6–10.4)
Chloride: 105 mmol/L (ref 98–110)
Creat: 0.88 mg/dL (ref 0.50–1.03)
Globulin: 2.9 g/dL (calc) (ref 1.9–3.7)
Glucose, Bld: 82 mg/dL (ref 65–99)
Potassium: 4.4 mmol/L (ref 3.5–5.3)
Sodium: 142 mmol/L (ref 135–146)
Total Bilirubin: 0.4 mg/dL (ref 0.2–1.2)
Total Protein: 7.3 g/dL (ref 6.1–8.1)
eGFR: 76 mL/min/{1.73_m2} (ref >=60)

## 2022-01-02 LAB — LIPID PANEL
Cholesterol: 260 mg/dL — ABNORMAL HIGH (ref <200)
HDL: 57 mg/dL (ref >=50)
LDL Cholesterol (Calc): 165 mg/dL (calc) — ABNORMAL HIGH
Non-HDL Cholesterol (Calc): 203 mg/dL (calc) — ABNORMAL HIGH (ref <130)
Total CHOL/HDL Ratio: 4.6 (calc) (ref <5.0)
Triglycerides: 215 mg/dL — ABNORMAL HIGH (ref <150)

## 2022-01-02 LAB — HEMOGLOBIN A1C
Hgb A1c MFr Bld: 5.7 % of total Hgb — ABNORMAL HIGH (ref <5.7)
Mean Plasma Glucose: 117 mg/dL
eAG (mmol/L): 6.5 mmol/L

## 2022-01-02 LAB — TSH: TSH: 1.15 mIU/L (ref 0.40–4.50)

## 2022-01-08 ENCOUNTER — Telehealth: Payer: Self-pay

## 2022-01-08 NOTE — Telephone Encounter (Signed)
PATIENT WILL CALL US BACK TO SCHEDULE ?

## 2022-11-13 ENCOUNTER — Other Ambulatory Visit: Payer: Self-pay | Admitting: Family Medicine

## 2022-11-13 DIAGNOSIS — Z1231 Encounter for screening mammogram for malignant neoplasm of breast: Secondary | ICD-10-CM

## 2022-12-23 ENCOUNTER — Ambulatory Visit
Admission: RE | Admit: 2022-12-23 | Discharge: 2022-12-23 | Disposition: A | Discharge status: Home / Self Care | Payer: Commercial Managed Care - PPO | Source: Ambulatory Visit | Attending: Family Medicine | Admitting: Family Medicine

## 2022-12-23 DIAGNOSIS — Z1231 Encounter for screening mammogram for malignant neoplasm of breast: Secondary | ICD-10-CM | POA: Diagnosis present

## 2022-12-24 ENCOUNTER — Encounter: Payer: Self-pay | Admitting: Family Medicine

## 2022-12-31 NOTE — Patient Instructions (Signed)
Preventive Care 40-60 Years Old, Female Preventive care refers to lifestyle choices and visits with your health care provider that can promote health and wellness. Preventive care visits are also called wellness exams. What can I expect for my preventive care visit? Counseling Your health care provider may ask you questions about your: Medical history, including: Past medical problems. Family medical history. Pregnancy history. Current health, including: Menstrual cycle. Method of birth control. Emotional well-being. Home life and relationship well-being. Sexual activity and sexual health. Lifestyle, including: Alcohol, nicotine or tobacco, and drug use. Access to firearms. Diet, exercise, and sleep habits. Work and work environment. Sunscreen use. Safety issues such as seatbelt and bike helmet use. Physical exam Your health care provider will check your: Height and weight. These may be used to calculate your BMI (body mass index). BMI is a measurement that tells if you are at a healthy weight. Waist circumference. This measures the distance around your waistline. This measurement also tells if you are at a healthy weight and may help predict your risk of certain diseases, such as type 2 diabetes and high blood pressure. Heart rate and blood pressure. Body temperature. Skin for abnormal spots. What immunizations do I need?  Vaccines are usually given at various ages, according to a schedule. Your health care provider will recommend vaccines for you based on your age, medical history, and lifestyle or other factors, such as travel or where you work. What tests do I need? Screening Your health care provider may recommend screening tests for certain conditions. This may include: Lipid and cholesterol levels. Diabetes screening. This is done by checking your blood sugar (glucose) after you have not eaten for a while (fasting). Pelvic exam and Pap test. Hepatitis B test. Hepatitis C  test. HIV (human immunodeficiency virus) test. STI (sexually transmitted infection) testing, if you are at risk. Lung cancer screening. Colorectal cancer screening. Mammogram. Talk with your health care provider about when you should start having regular mammograms. This may depend on whether you have a family history of breast cancer. BRCA-related cancer screening. This may be done if you have a family history of breast, ovarian, tubal, or peritoneal cancers. Bone density scan. This is done to screen for osteoporosis. Talk with your health care provider about your test results, treatment options, and if necessary, the need for more tests. Follow these instructions at home: Eating and drinking  Eat a diet that includes fresh fruits and vegetables, whole grains, lean protein, and low-fat dairy products. Take vitamin and mineral supplements as recommended by your health care provider. Do not drink alcohol if: Your health care provider tells you not to drink. You are pregnant, may be pregnant, or are planning to become pregnant. If you drink alcohol: Limit how much you have to 0-1 drink a day. Know how much alcohol is in your drink. In the [place] one drink equals one 12 oz bottle of beer (355 mL), one 5 oz glass of wine (148 mL), or one 1 oz glass of hard liquor (44 mL). Lifestyle Brush your teeth every morning and night with fluoride toothpaste. Floss one time each day. Exercise for at least 30 minutes 5 or more days each week. Do not use any products that contain nicotine or tobacco. These products include cigarettes, chewing tobacco, and vaping devices, such as e-cigarettes. If you need help quitting, ask your health care provider. Do not use drugs. If you are sexually active, practice safe sex. Use a condom or other form of protection to   prevent STIs. If you do not wish to become pregnant, use a form of birth control. If you plan to become pregnant, see your health care provider for a  prepregnancy visit. Take aspirin only as told by your health care provider. Make sure that you understand how much to take and what form to take. Work with your health care provider to find out whether it is safe and beneficial for you to take aspirin daily. Find healthy ways to manage stress, such as: Meditation, yoga, or listening to music. Journaling. Talking to a trusted person. Spending time with friends and family. Minimize exposure to UV radiation to reduce your risk of skin cancer. Safety Always wear your seat belt while driving or riding in a vehicle. Do not drive: If you have been drinking alcohol. Do not ride with someone who has been drinking. When you are tired or distracted. While texting. If you have been using any mind-altering substances or drugs. Wear a helmet and other protective equipment during sports activities. If you have firearms in your house, make sure you follow all gun safety procedures. Seek help if you have been physically or sexually abused. What's next? Visit your health care provider once a year for an annual wellness visit. Ask your health care provider how often you should have your eyes and teeth checked. Stay up to date on all vaccines. This information is not intended to replace advice given to you by your health care provider. Make sure you discuss any questions you have with your health care provider. Document Revised: 05/02/2021 Document Reviewed: 05/02/2021 Elsevier Patient Education  2023 Elsevier Inc.  

## 2022-12-31 NOTE — Progress Notes (Unsigned)
Name: Jennifer Oconnell   MRN: [id number]    DOB: 05-17-1961   Date:01/01/2023       Progress Note  Subjective  Chief Complaint  Annual Exam  HPI  Patient presents for annual CPE.  Insomnia: she has been doing well on Trazodone 50 mg at night, she is able to fall and stay asleep. She states also helps her mood. Last HCT was elevated, she used to snore and gasp for air but no problems lately. Last HCT was back to normal    Dyslipidemia/Atherosclerosis of aorta : she has not been physically active, walking on treadmill, she has Atherosclerosis of Aorta , discussed results with patient and the importance of statin therapy but she is not interested. She did quit smoking since 11/17/2022   Obesity: she states her weight was up to 220 lbs in Nov but she has been more mindful about her diet and also exercising since she quit smoking    History of polypectomy and ulcerative colitis: no inflammation, denies change in bowel movements or mucus/blood in stools, she is due for repeat colonoscopy , referral placed again    Goiter: going on for years, per patient biopsied years ago times 2 and released from ENT. TSH has been normal. She does not want to go back     Diet: cutting on sweets and trying to eat healthier Exercise: being more active since January 1st 2024  Last Eye Exam: she is due for eye exam Last Dental Exam:she has dentures and no oral lesions   [place] Office Visit from 01/01/2023 in [facility]  AUDIT-C Score 3     She was drinking heavily last year due to empty nest, but doing better now  Depression: Phq 9 is  negative    01/01/2023    2:21 PM 01/01/2022   12:57 PM 12/06/2020    3:02 PM 10/27/2020    3:39 PM 01/11/2020    7:51 AM  Depression screen PHQ 2/9  Decreased Interest 0 1 0 0 0  Down, Depressed, Hopeless 0 0 1 0 0  PHQ - 2 Score 0 1 1 0 0  Altered sleeping 0 0 1  0  Tired, decreased energy 0 0 1  0  Change in appetite 0 0 0  0   Feeling bad or failure about yourself  0 0 1  0  Trouble concentrating 0 0 0  0  Moving slowly or fidgety/restless 0 0 0  0  Suicidal thoughts 0 0 0  0  PHQ-9 Score 0 1 4  0  Difficult doing work/chores  Not difficult at all Not difficult at all     Hypertension: BP Readings from Last 3 Encounters:  01/01/23 122/70  01/01/22 134/76  12/06/20 136/88   Obesity: Wt Readings from Last 3 Encounters:  01/01/23 211 lb (95.7 kg)  01/01/22 205 lb (93 kg)  12/06/20 195 lb 12.8 oz (88.8 kg)   BMI Readings from Last 3 Encounters:  01/01/23 32.08 kg/m  01/01/22 31.17 kg/m  12/06/20 30.67 kg/m     Vaccines:   Tdap: up to date Shingrix: refused  Pneumonia: N/A Flu: up to date  COVID-19: up to date   Hep C Screening: 04/05/13 STD testing and prevention (HIV/chl/gon/syphilis): 09/15/19 Intimate partner violence: negative screen  Sexual History :one sexual partner , no problems  Menstrual History/LMP/Abnormal Bleeding: post-menopausal - she has some night sweats but mild  Discussed importance of follow up if any post-menopausal bleeding: yes  Incontinence Symptoms: negative for symptoms   Breast cancer:  - Last Mammogram: 25/24 - BRCA gene screening:  N/A  Osteoporosis Prevention : Discussed high calcium and vitamin D supplementation, weight bearing exercises Bone density: N/A  Cervical cancer screening: 12/06/20  Skin cancer: Discussed monitoring for atypical lesions  Colorectal cancer: 06/10/18   Lung cancer:  Low Dose CT Chest recommended if Age 30-80 years, 20 pack-year currently smoking OR have quit w/in 15years. Patient does not qualify for screen   ECG: 09/15/19  Advanced Care Planning: A voluntary discussion about advance care planning including the explanation and discussion of advance directives.  Discussed health care proxy and Living will, and the patient was able to identify a health care proxy as husband.  Patient does not have a living will and power of attorney  of health care   Lipids: Lab Results  Component Value Date   CHOL 260 (H) 01/01/2022   CHOL 243 (H) 12/06/2020   CHOL 267 (H) 09/15/2019   Lab Results  Component Value Date   HDL 57 01/01/2022   HDL 70 12/06/2020   HDL 52 09/15/2019   Lab Results  Component Value Date   LDLCALC 165 (H) 01/01/2022   LDLCALC 147 (H) 12/06/2020   LDLCALC 188 (H) 09/15/2019   Lab Results  Component Value Date   TRIG 215 (H) 01/01/2022   TRIG 136 12/06/2020   TRIG 136 09/15/2019   Lab Results  Component Value Date   CHOLHDL 4.6 01/01/2022   CHOLHDL 3.5 12/06/2020   CHOLHDL 5.1 (H) 09/15/2019   No results found for: "LDLDIRECT"  Glucose: Glucose, Bld  Date Value Ref Range Status  01/01/2022 82 65 - 99 mg/dL Final    Comment:    .            Fasting reference interval .   12/06/2020 83 65 - 99 mg/dL Final    Comment:    .            Fasting reference interval .   09/15/2019 102 (H) 65 - 99 mg/dL Final    Comment:    .            Fasting reference interval . For someone without known diabetes, a glucose value between 100 and 125 mg/dL is consistent with prediabetes and should be confirmed with a follow-up test. .     Patient Active Problem List   Diagnosis Date Noted   Aortic atherosclerosis ([place]) 12/26/2020   Dysthymia 09/15/2019   Overweight (BMI 25.0-29.9) 07/29/2018   Anxiety 07/29/2018   History of abnormal cervical Pap smear 03/23/2018   Goiter 08/13/2016   History of ulcerative colitis 08/13/2016   Tobacco use 08/13/2016   Dyslipidemia 08/13/2016   Personal history of other malignant neoplasm of skin 01/03/2014    Past Surgical History:  Procedure Laterality Date   Cone biopsy   04/2018   clear margins    MOHS SURGERY  2014   Face    Family History  Problem Relation Age of Onset   Cancer Mother        colon   Cancer Father        Lung   Cancer Sister        pancreatic   Cancer Sister        skin   Cancer Brother        Lung    Social  History   Socioeconomic History   Marital status: Married    Spouse  name: Magda Paganini   Number of children: 70   Years of education: Not on file   Highest education level: Associate degree: occupational, Hotel manager, or vocational program  Occupational History   OccupationBuyer, retail of nursing     Employer: [employer]  Tobacco Use   Smoking status: Former    Packs/day: 0.15    Years: 40.00    Total pack years: 6.00    Types: Cigarettes    Start date: 08/13/1976    Quit date: 11/17/2022    Years since quitting: 0.1   Smokeless tobacco: Never  Vaping Use   Vaping Use: Never used  Substance and Sexual Activity   Alcohol use: Not on file   Drug use: No   Sexual activity: Yes    Partners: Male    Birth control/protection: None  Other Topics Concern   Not on file  Social History Narrative   Married, 8 biological children, 2 step-children and 7 adopted children.    She has 19 grandchildren   Working at [employer] [place] Strain: Low Risk  (01/01/2023)   Overall Financial Resource Strain (CARDIA)    Difficulty of Paying Living Expenses: Not hard at all  Food Insecurity: No Food Insecurity (01/01/2023)   Hunger Vital Sign    Worried About Running Out of Food in the Last Year: Never true    [place] in the Last Year: Never true  Transportation Needs: No Transportation Needs (01/01/2023)   PRAPARE - Hydrologist (Medical): No    Lack of Transportation (Non-Medical): No  Physical Activity: Insufficiently Active (01/01/2023)   Exercise Vital Sign    Days of Exercise per Week: 3 days    Minutes of Exercise per Session: 20 min  Stress: Stress Concern Present (01/01/2023)   [place]    Feeling of Stress : To some extent  Social Connections: Moderately Integrated (01/01/2023)   Social Connection and Isolation  Panel [NHANES]    Frequency of Communication with Friends and Family: More than three times a week    Frequency of Social Gatherings with Friends and Family: Once a week    Attends Religious Services: More than 4 times per year    Active Member of [employer] or Organizations: No    Attends Archivist Meetings: Never    Marital Status: Married  Human resources officer Violence: Not At Risk (01/01/2023)   Humiliation, Afraid, Rape, and Kick questionnaire    Fear of Current or Ex-Partner: No    Emotionally Abused: No    Physically Abused: No    Sexually Abused: No     Current Outpatient Medications:    Ascorbic Acid (VITAMIN C) 1000 MG tablet, Take 4,000 mg by mouth daily., Disp: , Rfl:    B Complex Vitamins (VITAMIN B COMPLEX PO), Take by mouth., Disp: , Rfl:    Biotin 10000 MCG TABS, Take by mouth., Disp: , Rfl:    Cholecalciferol (VITAMIN D) 50 MCG (2000 UT) CAPS, Take 1 capsule by mouth daily at 12 noon., Disp: , Rfl:    senna-docusate (SENNA S) 8.6-50 MG tablet, , Disp: , Rfl:    ST JOHNS WORT PO, Take by mouth daily., Disp: , Rfl:    Turmeric (QC TUMERIC COMPLEX PO), Take by mouth., Disp: , Rfl:    traZODone (DESYREL) 50 MG tablet, Take 1 tablet (50  mg total) by mouth at bedtime., Disp: 90 tablet, Rfl: 3  No Known Allergies   ROS  Constitutional: Negative for fever , positive for weight change.  Respiratory: Negative for cough and shortness of breath.   Cardiovascular: Negative for chest pain or palpitations.  Gastrointestinal: Negative for abdominal pain, no bowel changes.  Musculoskeletal: Negative for gait problem or joint swelling.  Skin: Negative for rash.  Neurological: Negative for dizziness or headache.  No other specific complaints in a complete review of systems (except as listed in HPI above).   Objective  Vitals:   01/01/23 1421  BP: 122/70  Pulse: 77  Resp: 16  SpO2: 98%  Weight: 211 lb (95.7 kg)  Height: 5' 8"$  (1.727 m)    Body mass index is 32.08  kg/m.  Physical Exam  Constitutional: Patient appears well-developed and well-nourished. Obese No distress.  HENT: Head: Normocephalic and atraumatic. Ears: B TMs ok, no erythema or effusion; Nose: Nose normal. Mouth/Throat: Oropharynx is clear and moist. No oropharyngeal exudate.  Eyes: Conjunctivae and EOM are normal. Pupils are equal, round, and reactive to light. No scleral icterus.  Neck: Normal range of motion. Neck supple. No JVD present. No thyromegaly present.  Cardiovascular: Normal rate, regular rhythm and normal heart sounds.  No murmur heard. No BLE edema. Pulmonary/Chest: Effort normal and breath sounds normal. No respiratory distress. Abdominal: Soft. Bowel sounds are normal, no distension. There is no tenderness. no masses Breast: no lumps or masses, no nipple discharge or rashes FEMALE GENITALIA:  Not done  RECTAL: not done  Musculoskeletal: Normal range of motion, no joint effusions. No gross deformities Neurological: he is alert and oriented to person, place, and time. No cranial nerve deficit. Coordination, balance, strength, speech and gait are normal.  Skin: Skin is warm and dry. No rash noted. No erythema.  Psychiatric: Patient has a normal mood and affect. behavior is normal. Judgment and thought content normal.    Fall Risk:    01/01/2023    2:21 PM 01/01/2022   12:56 PM 12/06/2020    3:02 PM 10/27/2020    3:26 PM 01/11/2020    7:51 AM  Fall Risk   Falls in the past year? 0 0  0 0  Number falls in past yr: 0 0 0 0 0  Injury with Fall? 0 0 0 0 0  Risk for fall due to : No Fall Risks No Fall Risks     Follow up Falls prevention discussed Falls prevention discussed        Functional Status Survey: Is the patient deaf or have difficulty hearing?: No Does the patient have difficulty seeing, even when wearing glasses/contacts?: No Does the patient have difficulty concentrating, remembering, or making decisions?: No Does the patient have difficulty walking or  climbing stairs?: No Does the patient have difficulty dressing or bathing?: No Does the patient have difficulty doing errands alone such as visiting a doctor's office or shopping?: No   Assessment & Plan  1. Well adult exam  - Lipid panel - COMPLETE METABOLIC PANEL WITH GFR - CBC with Differential/Platelet - TSH - Hemoglobin A1c  2. Aortic atherosclerosis ([facility])  Refused statin  Discussed aspirin 81 mg dose  3. Dyslipidemia  - Lipid panel  4. Goiter  - TSH  5. Colon cancer screening  - Ambulatory referral to Gastroenterology  6. Psychophysiological insomnia  - traZODone (DESYREL) 50 MG tablet; Take 1 tablet (50 mg total) by mouth at bedtime.  Dispense: 90 tablet; Refill: 3  7. Hyperglycemia  - Hemoglobin A1c    -USPSTF grade A and B recommendations reviewed with patient; age-appropriate recommendations, preventive care, screening tests, etc discussed and encouraged; healthy living encouraged; see AVS for patient education given to patient -Discussed importance of 150 minutes of physical activity weekly, eat two servings of fish weekly, eat one serving of tree nuts ( cashews, pistachios, pecans, almonds.Marland Kitchen) every other day, eat 6 servings of fruit/vegetables daily and drink plenty of water and avoid sweet beverages.   -Reviewed Health Maintenance: Yes.

## 2023-01-01 ENCOUNTER — Ambulatory Visit (INDEPENDENT_AMBULATORY_CARE_PROVIDER_SITE_OTHER): Payer: Commercial Managed Care - PPO | Admitting: Family Medicine

## 2023-01-01 ENCOUNTER — Encounter: Payer: Self-pay | Admitting: Family Medicine

## 2023-01-01 VITALS — BP 122/70 | HR 77 | Resp 16 | Ht 68.0 in | Wt 211.0 lb

## 2023-01-01 DIAGNOSIS — E785 Hyperlipidemia, unspecified: Secondary | ICD-10-CM | POA: Diagnosis not present

## 2023-01-01 DIAGNOSIS — F5104 Psychophysiologic insomnia: Secondary | ICD-10-CM | POA: Diagnosis not present

## 2023-01-01 DIAGNOSIS — Z0000 Encounter for general adult medical examination without abnormal findings: Secondary | ICD-10-CM | POA: Diagnosis not present

## 2023-01-01 DIAGNOSIS — I700 Atherosclerosis of aorta: Secondary | ICD-10-CM

## 2023-01-01 DIAGNOSIS — Z1211 Encounter for screening for malignant neoplasm of colon: Secondary | ICD-10-CM

## 2023-01-01 DIAGNOSIS — E049 Nontoxic goiter, unspecified: Secondary | ICD-10-CM

## 2023-01-01 DIAGNOSIS — R739 Hyperglycemia, unspecified: Secondary | ICD-10-CM

## 2023-01-01 MED ORDER — TRAZODONE HCL 50 MG PO TABS: 50.0000 mg | ORAL_TABLET | Freq: Every day | ORAL | 3 refills | Status: DC

## 2023-01-02 ENCOUNTER — Telehealth: Payer: Self-pay

## 2023-01-02 LAB — CBC WITH DIFFERENTIAL/PLATELET
Absolute Monocytes: 631 cells/uL (ref 200–950)
Basophils Absolute: 82 cells/uL (ref 0–200)
Basophils Relative: 1 %
Eosinophils Absolute: 197 cells/uL (ref 15–500)
Eosinophils Relative: 2.4 %
HCT: 43.7 % (ref 35.0–45.0)
Hemoglobin: 14.8 g/dL (ref 11.7–15.5)
Lymphs Abs: 3592 cells/uL (ref 850–3900)
MCH: 28.5 pg (ref 27.0–33.0)
MCHC: 33.9 g/dL (ref 32.0–36.0)
MCV: 84.2 fL (ref 80.0–100.0)
MPV: 11.1 fL (ref 7.5–12.5)
Monocytes Relative: 7.7 %
Neutro Abs: 3698 cells/uL (ref 1500–7800)
Neutrophils Relative %: 45.1 %
Platelets: 231 10*3/uL (ref 140–400)
RBC: 5.19 10*6/uL — ABNORMAL HIGH (ref 3.80–5.10)
RDW: 12.4 % (ref 11.0–15.0)
Total Lymphocyte: 43.8 %
WBC: 8.2 10*3/uL (ref 3.8–10.8)

## 2023-01-02 LAB — COMPLETE METABOLIC PANEL WITH GFR
AG Ratio: 1.7 (calc) (ref 1.0–2.5)
ALT: 21 U/L (ref 6–29)
AST: 20 U/L (ref 10–35)
Albumin: 4.8 g/dL (ref 3.6–5.1)
Alkaline phosphatase (APISO): 103 U/L (ref 37–153)
BUN: 12 mg/dL (ref 7–25)
CO2: 28 mmol/L (ref 20–32)
Calcium: 9.9 mg/dL (ref 8.6–10.4)
Chloride: 103 mmol/L (ref 98–110)
Creat: 0.96 mg/dL (ref 0.50–1.03)
Globulin: 2.9 g/dL (calc) (ref 1.9–3.7)
Glucose, Bld: 92 mg/dL (ref 65–99)
Potassium: 4 mmol/L (ref 3.5–5.3)
Sodium: 143 mmol/L (ref 135–146)
Total Bilirubin: 0.4 mg/dL (ref 0.2–1.2)
Total Protein: 7.7 g/dL (ref 6.1–8.1)
eGFR: 68 mL/min/{1.73_m2} (ref >=60)

## 2023-01-02 LAB — HEMOGLOBIN A1C
Hgb A1c MFr Bld: 6.1 % of total Hgb — ABNORMAL HIGH (ref <5.7)
Mean Plasma Glucose: 128 mg/dL
eAG (mmol/L): 7.1 mmol/L

## 2023-01-02 LAB — LIPID PANEL
Cholesterol: 254 mg/dL — ABNORMAL HIGH (ref <200)
HDL: 57 mg/dL (ref >=50)
LDL Cholesterol (Calc): 160 mg/dL (calc) — ABNORMAL HIGH
Non-HDL Cholesterol (Calc): 197 mg/dL (calc) — ABNORMAL HIGH (ref <130)
Total CHOL/HDL Ratio: 4.5 (calc) (ref <5.0)
Triglycerides: 201 mg/dL — ABNORMAL HIGH (ref <150)

## 2023-01-02 LAB — TSH: TSH: 1.11 mIU/L (ref 0.40–4.50)

## 2023-01-02 NOTE — Telephone Encounter (Signed)
Patient not ready to schedule colonoscopy at this time.  Would like to schedule in the Fall.  Letter to be sent to her via mychart to call to schedule when she is ready.  Thanks, [place], [place]

## 2023-08-07 ENCOUNTER — Telehealth: Payer: Self-pay

## 2023-08-07 NOTE — Telephone Encounter (Signed)
PT requesting call back to schedule colonoscopy

## 2023-08-11 ENCOUNTER — Telehealth: Payer: Self-pay

## 2023-08-11 NOTE — Telephone Encounter (Signed)
PT requesting call back to schedule colonoscopy

## 2023-08-12 NOTE — Telephone Encounter (Signed)
Unable to return call back to patient.  Voice mailbox is currently full.

## 2023-08-15 NOTE — Telephone Encounter (Signed)
Unable to return call back to patient.  Voice mailbox is currently full.

## 2023-08-18 ENCOUNTER — Other Ambulatory Visit: Payer: Self-pay | Admitting: *Deleted

## 2023-08-18 ENCOUNTER — Telehealth: Payer: Self-pay | Admitting: *Deleted

## 2023-08-18 DIAGNOSIS — Z1211 Encounter for screening for malignant neoplasm of colon: Secondary | ICD-10-CM

## 2023-08-18 MED ORDER — NA SULFATE-K SULFATE-MG SULF 17.5-3.13-1.6 GM/177ML PO SOLN: 1.0000 | Freq: Once | ORAL | 0 refills | Status: AC

## 2023-08-18 MED ORDER — NA SULFATE-K SULFATE-MG SULF 17.5-3.13-1.6 GM/177ML PO SOLN: 1.0000 | Freq: Once | ORAL | 0 refills | Status: DC

## 2023-08-18 NOTE — Addendum Note (Signed)
Addended by: Tawnya Crook on: 08/18/2023 10:19 AM   Modules accepted: Orders

## 2023-08-18 NOTE — Telephone Encounter (Signed)
Gastroenterology Pre-Procedure Review  Request Date: 09/26/2023 Requesting Physician: Dr. Allegra Lai  PATIENT REVIEW QUESTIONS: The patient responded to the following health history questions as indicated:    1. Are you having any GI issues? no 2. Do you have a personal history of Polyps? no 3. Do you have a family history of Colon Cancer or Polyps? no 4. Diabetes Mellitus? no 5. Joint replacements in the past 12 months?no 6. Major health problems in the past 3 months?no 7. Any artificial heart valves, MVP, or defibrillator?no    MEDICATIONS & ALLERGIES:    Patient reports the following regarding taking any anticoagulation/antiplatelet therapy:   Plavix, Coumadin, Eliquis, Xarelto, Lovenox, Pradaxa, Brilinta, or Effient? no Aspirin? no  Patient confirms/reports the following medications:  Current Outpatient Medications  Medication Sig Dispense Refill   Ascorbic Acid (VITAMIN C) 1000 MG tablet Take 4,000 mg by mouth daily.     B Complex Vitamins (VITAMIN B COMPLEX PO) Take by mouth.     Biotin 95638 MCG TABS Take by mouth.     Cholecalciferol (VITAMIN D) 50 MCG (2000 UT) CAPS Take 1 capsule by mouth daily at 12 noon.     senna-docusate (SENNA S) 8.6-50 MG tablet      ST JOHNS WORT PO Take by mouth daily.     traZODone (DESYREL) 50 MG tablet Take 1 tablet (50 mg total) by mouth at bedtime. 90 tablet 3   Turmeric (QC TUMERIC COMPLEX PO) Take by mouth.     No current facility-administered medications for this visit.    Patient confirms/reports the following allergies:  No Known Allergies  No orders of the defined types were placed in this encounter.   AUTHORIZATION INFORMATION Primary Insurance: 1D#: Group #:  Secondary Insurance: 1D#: Group #:  SCHEDULE INFORMATION: Date: 09/26/2023 Time: Location:  [facility]

## 2023-08-18 NOTE — Telephone Encounter (Signed)
Colonoscopy schedule with Dr Allegra Lai on 09/26/2023

## 2023-09-15 ENCOUNTER — Encounter: Payer: Self-pay | Admitting: Gastroenterology

## 2023-09-25 ENCOUNTER — Encounter: Payer: Self-pay | Admitting: Gastroenterology

## 2023-09-26 ENCOUNTER — Ambulatory Visit
Admission: RE | Admit: 2023-09-26 | Discharge: 2023-09-26 | Disposition: A | Discharge status: Home / Self Care | Payer: Commercial Managed Care - PPO | Attending: Gastroenterology | Admitting: Gastroenterology

## 2023-09-26 ENCOUNTER — Ambulatory Visit: Payer: Commercial Managed Care - PPO | Admitting: Certified Registered Nurse Anesthetist

## 2023-09-26 ENCOUNTER — Encounter
Admission: RE | Disposition: A | Discharge status: Home / Self Care | Payer: Self-pay | Source: Home / Self Care | Attending: Gastroenterology

## 2023-09-26 DIAGNOSIS — Z8711 Personal history of peptic ulcer disease: Secondary | ICD-10-CM | POA: Diagnosis not present

## 2023-09-26 DIAGNOSIS — Z860101 Personal history of adenomatous and serrated colon polyps: Secondary | ICD-10-CM | POA: Diagnosis not present

## 2023-09-26 DIAGNOSIS — Q438 Other specified congenital malformations of intestine: Secondary | ICD-10-CM | POA: Insufficient documentation

## 2023-09-26 DIAGNOSIS — Z1211 Encounter for screening for malignant neoplasm of colon: Secondary | ICD-10-CM | POA: Diagnosis present

## 2023-09-26 DIAGNOSIS — Z87891 Personal history of nicotine dependence: Secondary | ICD-10-CM | POA: Insufficient documentation

## 2023-09-26 HISTORY — PX: COLONOSCOPY WITH PROPOFOL: SHX5780

## 2023-09-26 HISTORY — DX: Carcinoma in situ of cervix, unspecified: D06.9

## 2023-09-26 SURGERY — COLONOSCOPY WITH PROPOFOL
Anesthesia: General

## 2023-09-26 MED ORDER — PROPOFOL 500 MG/50ML IV EMUL
INTRAVENOUS | Status: DC | PRN
  Administered 2023-09-26: 180 ug/kg/min via INTRAVENOUS
  Administered 2023-09-26: 100 mg via INTRAVENOUS

## 2023-09-26 MED ORDER — LIDOCAINE HCL (CARDIAC) PF 100 MG/5ML IV SOSY
PREFILLED_SYRINGE | INTRAVENOUS | Status: DC | PRN
Start: 2023-09-26 — End: 2023-09-26
  Administered 2023-09-26: 50 mg via INTRATRACHEAL

## 2023-09-26 MED ORDER — SODIUM CHLORIDE 0.9 % IV SOLN
INTRAVENOUS | Status: DC | PRN
Start: 2023-09-26 — End: 2023-09-26

## 2023-09-26 MED ORDER — SODIUM CHLORIDE 0.9 % IV SOLN
INTRAVENOUS | Status: DC
  Administered 2023-09-26: 20 mL/h via INTRAVENOUS

## 2023-09-26 NOTE — Transfer of Care (Signed)
Immediate Anesthesia Transfer of Care Note  Patient: Jennifer Oconnell  Procedure(s) Performed: COLONOSCOPY WITH PROPOFOL  Patient Location: PACU and Endoscopy Unit  Anesthesia Type:General  Level of Consciousness: drowsy  Airway & Oxygen Therapy: Patient Spontanous Breathing  Post-op Assessment: Report given to RN  Post vital signs: Reviewed and stable  Last Vitals:  Vitals Value Taken Time  BP 120/84 09/26/23 0913  Temp    Pulse 86 09/26/23 0913  Resp 10 09/26/23 0913  SpO2 98 % 09/26/23 0913    Last Pain:  Vitals:   09/26/23 0913  TempSrc:   PainSc: Asleep         Complications: No notable events documented.

## 2023-09-26 NOTE — Op Note (Signed)
[facility] Gastroenterology Patient Name: Jennifer Oconnell Procedure Date: 09/26/2023 8:54 AM MRN: [id number] Account #: [id number] Date of Birth: January 01, 1961 Admit Type: Outpatient Age: 62 Room: [facility] ENDO ROOM 1 Gender: Female Note Status: Finalized Instrument Name: Prentice Docker [id number] Procedure:             Colonoscopy Indications:           Surveillance: Personal history of adenomatous polyps                         on last colonoscopy 5 years ago, Last colonoscopy 5                         years ago Providers:             Toney Reil MD, MD Referring MD:          Onnie Boer. Sowles, MD (Referring MD) Medicines:             General Anesthesia Complications:         No immediate complications. Estimated blood loss: None. Procedure:             Pre-Anesthesia Assessment:                        - Prior to the procedure, a History and Physical was                         performed, and patient medications and allergies were                         reviewed. The patient is competent. The risks and                         benefits of the procedure and the sedation options and                         risks were discussed with the patient. All questions                         were answered and informed consent was obtained.                         Patient identification and proposed procedure were                         verified by the physician, the nurse, the                         anesthesiologist, the anesthetist and the technician                         in the pre-procedure area in the procedure room in the                         endoscopy suite. Mental Status Examination: alert and                         oriented. Airway Examination: normal oropharyngeal  airway and neck mobility. Respiratory Examination:                         clear to auscultation. CV Examination: normal.                         Prophylactic Antibiotics: The  patient does not require                         prophylactic antibiotics. Prior Anticoagulants: The                         patient has taken no anticoagulant or antiplatelet                         agents. ASA Grade Assessment: II - A patient with mild                         systemic disease. After reviewing the risks and                         benefits, the patient was deemed in satisfactory                         condition to undergo the procedure. The anesthesia                         plan was to use general anesthesia. Immediately prior                         to administration of medications, the patient was                         re-assessed for adequacy to receive sedatives. The                         heart rate, respiratory rate, oxygen saturations,                         blood pressure, adequacy of pulmonary ventilation, and                         response to care were monitored throughout the                         procedure. The physical status of the patient was                         re-assessed after the procedure.                        After obtaining informed consent, the colonoscope was                         passed under direct vision. Throughout the procedure,                         the patient's blood pressure, pulse, and oxygen  saturations were monitored continuously. The                         Colonoscope was introduced through the anus and                         advanced to the the cecum, identified by appendiceal                         orifice and ileocecal valve. The colonoscopy was                         unusually difficult due to inadequate bowel prep, a                         redundant colon, significant looping and the patient's                         body habitus. Successful completion of the procedure                         was aided by applying abdominal pressure. The patient                         tolerated the  procedure well. The quality of the bowel                         preparation was poor. The ileocecal valve, appendiceal                         orifice, and rectum were photographed. Findings:      The perianal and digital rectal examinations were normal. Pertinent       negatives include normal sphincter tone and no palpable rectal lesions.      Extensive amounts of semi-solid stool was found in the entire colon,       precluding visualization.      The retroflexed view of the distal rectum and anal verge was normal and       showed no anal or rectal abnormalities. Impression:            - Preparation of the colon was poor.                        - Stool in the entire examined colon.                        - The distal rectum and anal verge are normal on                         retroflexion view.                        - No specimens collected. Recommendation:        - Discharge patient to home (with escort).                        - Resume previous diet today.                        -  Continue present medications.                        - Repeat colonoscopy in 6 months with 2 day prep on                         mondays only because the bowel preparation was                         suboptimal. Procedure Code(s):     --- Professional ---                        Z6109, Colorectal cancer screening; colonoscopy on                         individual at high risk Diagnosis Code(s):     --- Professional ---                        Z86.010, Personal history of colonic polyps CPT copyright 2022 American Medical Association. All rights reserved. The codes documented in this report are preliminary and upon coder review may  be revised to meet current compliance requirements. Dr. Libby Maw Toney Reil MD, MD 09/26/2023 9:12:26 AM This report has been signed electronically. Number of Addenda: 0 Note Initiated On: 09/26/2023 8:54 AM Scope Withdrawal Time: 0 hours 5 minutes 17 seconds   Total Procedure Duration: 0 hours 10 minutes 48 seconds  Estimated Blood Loss:  Estimated blood loss: none.      [facility]

## 2023-09-26 NOTE — H&P (Signed)
Arlyss Repress, MD [address]  [address]  [place], [place] [address]  Main: [phone]  Fax: [phone] Pager: [phone]  Primary Care Physician:  Alba Cory, MD Primary Gastroenterologist:  Dr. Arlyss Repress  Pre-Procedure History & Physical: HPI:  Jennifer Oconnell is a 62 y.o. female is here for an colonoscopy.   Past Medical History:  Diagnosis Date   Cervical intraepithelial neoplasia grade 3    Goiter    High risk HPV infection    Hyperlipemia    LGSIL (low grade squamous intraepithelial dysplasia)    Mohs defect of forehead 2014   Overweight    Shift work sleep disorder    Tobacco use disorder    Ulcerative colitis (HCC) 1996    Past Surgical History:  Procedure Laterality Date   COLPOSCOPY W/ BIOPSY / CURETTAGE     Cone biopsy   04/2018   clear margins    MOHS SURGERY  2014   Face    Prior to Admission medications   Medication Sig Start Date End Date Taking? Authorizing Provider  Ascorbic Acid (VITAMIN C) 1000 MG tablet Take 4,000 mg by mouth daily.   Yes [provider]  B Complex Vitamins (VITAMIN B COMPLEX PO) Take by mouth.   Yes [provider]  Biotin 32440 MCG TABS Take by mouth.   Yes [provider]  Cholecalciferol (VITAMIN D) 50 MCG (2000 UT) CAPS Take 1 capsule by mouth daily at 12 noon.   Yes [provider]  senna-docusate (SENNA S) 8.6-50 MG tablet  04/25/22  Yes [provider]  ST JOHNS WORT PO Take by mouth daily.   Yes [provider]  traZODone (DESYREL) 50 MG tablet Take 1 tablet (50 mg total) by mouth at bedtime. 01/01/23  Yes Sowles, Danna Hefty, MD  Turmeric (QC TUMERIC COMPLEX PO) Take by mouth.   Yes [provider]    Allergies as of 08/18/2023   (No Known Allergies)    Family History  Problem Relation Age of Onset   Cancer Mother        colon   Cancer Father        Lung   Cancer Sister        pancreatic   Cancer Sister        skin   Cancer  Brother        Lung    Social History   Socioeconomic History   Marital status: Married    Spouse name: Verlon Au   Number of children: 17   Years of education: Not on file   Highest education level: Associate degree: occupational, Scientist, product/process development, or vocational program  Occupational History   Occupation: Interior and spatial designer of nursing     Employer: [employer]  Tobacco Use   Smoking status: Former    Current packs/day: 0.00    Average packs/day: 0.2 packs/day for 46.3 years (6.9 ttl pk-yrs)    Types: Cigarettes    Start date: 08/13/1976    Quit date: 11/17/2022    Years since quitting: 0.8   Smokeless tobacco: Never  Vaping Use   Vaping status: Never Used  Substance and Sexual Activity   Alcohol use: Yes    Alcohol/week: 1.0 standard drink of alcohol    Types: 1 Glasses of wine per week   Drug use: No   Sexual activity: Yes    Partners: Male    Birth control/protection: None  Other Topics Concern   Not on file  Social  History Narrative   Married, 8 biological children, 2 step-children and 7 adopted children.    She has 19 grandchildren   Working at [employer] - she is working at [employer] care    Social Determinants of [employer]: Low Risk  (01/01/2023)   Overall Financial Resource Strain (CARDIA)    Difficulty of Paying Living Expenses: Not hard at all  Food Insecurity: No Food Insecurity (01/01/2023)   Hunger Vital Sign    Worried About Running Out of Food in the Last Year: Never true    Ran Out of Food in the Last Year: Never true  Transportation Needs: No Transportation Needs (01/01/2023)   PRAPARE - Administrator, Civil Service (Medical): No    Lack of Transportation (Non-Medical): No  Physical Activity: Insufficiently Active (01/01/2023)   Exercise Vital Sign    Days of Exercise per Week: 3 days    Minutes of Exercise per Session: 20 min  Stress: Stress Concern Present (01/01/2023)   [employer] of Occupational Health -  Occupational Stress Questionnaire    Feeling of Stress : To some extent  Social Connections: Moderately Integrated (01/01/2023)   Social Connection and Isolation Panel [NHANES]    Frequency of Communication with Friends and Family: More than three times a week    Frequency of Social Gatherings with Friends and Family: Once a week    Attends Religious Services: More than 4 times per year    Active Member of [employer] or Organizations: No    Attends Banker Meetings: Never    Marital Status: Married  Catering manager Violence: Not At Risk (01/01/2023)   Humiliation, Afraid, Rape, and Kick questionnaire    Fear of Current or Ex-Partner: No    Emotionally Abused: No    Physically Abused: No    Sexually Abused: No    Review of Systems: See HPI, otherwise negative ROS  Physical Exam: BP (!) 117/97   Pulse 88   Temp (!) 96.7 F (35.9 C) (Temporal)   Resp 20   Ht 5\' 8"  (1.727 m)   Wt 98 kg   LMP 08/10/2016   SpO2 100%   BMI 32.84 kg/m  General:   Alert,  pleasant and cooperative in NAD Oconnell:  Normocephalic and atraumatic. Neck:  Supple; no masses or thyromegaly. Lungs:  Clear throughout to auscultation.    Heart:  Regular rate and rhythm. Abdomen:  Soft, nontender and nondistended. Normal bowel sounds, without guarding, and without rebound.   Neurologic:  Alert and  oriented x4;  grossly normal neurologically.  Impression/Plan: Jennifer Oconnell is here for an colonoscopy to be performed for h/o colon adenoma  Risks, benefits, limitations, and alternatives regarding  colonoscopy have been reviewed with the patient.  Questions have been answered.  All parties agreeable.   Lannette Donath, MD  09/26/2023, 8:46 AM

## 2023-09-26 NOTE — Anesthesia Postprocedure Evaluation (Signed)
Anesthesia Post Note  Patient: Jennifer Oconnell  Procedure(s) Performed: COLONOSCOPY WITH PROPOFOL  Patient location during evaluation: Endoscopy Anesthesia Type: General Level of consciousness: awake and alert Pain management: pain level controlled Vital Signs Assessment: post-procedure vital signs reviewed and stable Respiratory status: spontaneous breathing, nonlabored ventilation, respiratory function stable and patient connected to nasal cannula oxygen Cardiovascular status: blood pressure returned to baseline and stable Postop Assessment: no apparent nausea or vomiting Anesthetic complications: no   No notable events documented.   Last Vitals:  Vitals:   09/26/23 0923 09/26/23 0934  BP: 132/77 (!) 143/91  Pulse: 76 81  Resp: 19 20  Temp:    SpO2: 100% 100%    Last Pain:  Vitals:   09/26/23 0934  TempSrc:   PainSc: 5                  Louie Boston

## 2023-09-26 NOTE — Anesthesia Preprocedure Evaluation (Signed)
Anesthesia Evaluation  Patient identified by MRN, date of birth, ID band Patient awake    Reviewed: Allergy & Precautions, NPO status , Patient's Chart, lab work & pertinent test results  History of Anesthesia Complications Negative for: history of anesthetic complications  Airway Mallampati: III  TM Distance: >3 FB Neck ROM: full    Dental  (+) Edentulous Upper, Edentulous Lower   Pulmonary Current Smoker   Pulmonary exam normal        Cardiovascular negative cardio ROS Normal cardiovascular exam     Neuro/Psych  PSYCHIATRIC DISORDERS Anxiety Depression    negative neurological ROS     GI/Hepatic Neg liver ROS, PUD,,,  Endo/Other  negative endocrine ROS    Renal/GU negative Renal ROS  negative genitourinary   Musculoskeletal   Abdominal   Peds  Hematology negative hematology ROS (+)   Anesthesia Other Findings Past Medical History: No date: Cervical intraepithelial neoplasia grade 3 No date: Goiter No date: High risk HPV infection No date: Hyperlipemia No date: LGSIL (low grade squamous intraepithelial dysplasia) 2014: Mohs defect of forehead No date: Overweight No date: Shift work sleep disorder No date: Tobacco use disorder 1996: Ulcerative colitis (HCC)  Past Surgical History: No date: COLPOSCOPY W/ BIOPSY / CURETTAGE 04/2018: Cone biopsy      Comment:  clear margins  2014: MOHS SURGERY     Comment:  Face  BMI    Body Mass Index: 32.84 kg/m      Reproductive/Obstetrics negative OB ROS                             Anesthesia Physical Anesthesia Plan  ASA: 2  Anesthesia Plan: General   Post-op Pain Management: Minimal or no pain anticipated   Induction: Intravenous  PONV Risk Score and Plan: 2 and Propofol infusion and TIVA  Airway Management Planned: Natural Airway and Nasal Cannula  Additional Equipment:   Intra-op Plan:   Post-operative Plan:    Informed Consent: I have reviewed the patients History and Physical, chart, labs and discussed the procedure including the risks, benefits and alternatives for the proposed anesthesia with the patient or authorized representative who has indicated his/her understanding and acceptance.     Dental Advisory Given  Plan Discussed with: Anesthesiologist, CRNA and Surgeon  Anesthesia Plan Comments: (Patient consented for risks of anesthesia including but not limited to:  - adverse reactions to medications - risk of airway placement if required - damage to eyes, teeth, lips or other oral mucosa - nerve damage due to positioning  - sore throat or hoarseness - Damage to heart, brain, nerves, lungs, other parts of body or loss of life  Patient voiced understanding and assent.)       Anesthesia Quick Evaluation

## 2023-09-29 ENCOUNTER — Encounter: Payer: Self-pay | Admitting: Gastroenterology

## 2023-09-30 ENCOUNTER — Encounter: Payer: Self-pay | Admitting: Family Medicine

## 2023-10-08 NOTE — Progress Notes (Unsigned)
Name: Jennifer Oconnell   MRN: [id number]    DOB: [birth year]   Date:10/09/2023       Progress Note  Subjective  Chief Complaint  Hypertension  HPI  Discussed the use of AI scribe software for clinical note transcription with the patient, who gave verbal consent to proceed.  History of Present Illness   The patient, a 62 year old with a history of hypertension, presented with concerns about a recent spike in blood pressure. She reported checking her blood pressure at work approximately two weeks ago and found it to be 190/106 and 190/102 on two separate occasions. 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 Despite recent efforts to increase physical activity and improve diet, the patient has been unable to lose weight and has actually gained eight pounds since her last visit in February.  The patient also reported a long-standing history of anxiety and depression, which her son, a pharmacist, suggested could be contributing to the elevated blood pressure. She has previously taken Wellbutrin and Citalopram for these conditions but stopped due to side effects. The patient expressed frustration with her current health status, particularly as she has been making efforts to improve her lifestyle.  In addition to hypertension, the patient has a history of borderline diabetes with a recent HbA1c of 6.1, up from 5.7 two years ago. She denied any chest pain or shortness of breath. The patient also reported a history of good blood pressure control, even during pregnancy, and has never previously required antihypertensive medication.  The patient has a family history of bipolar disorder, with both her mother and daughter diagnosed with the condition. She also reported a history of taking Xanax in the afternoons to manage stress when her children were younger. The patient denied any changes in work or stress levels and reported no headaches associated with the high blood pressure.  She recently stopped consuming caffeine and energy drinks on the advice of her son.                  Patient Active Problem List   Diagnosis Date Noted   Screening for colon cancer 09/26/2023   Aortic atherosclerosis (HCC) 12/26/2020   Dysthymia 09/15/2019   Overweight (BMI 25.0-29.9) 07/29/2018   Anxiety 07/29/2018   History of abnormal cervical Pap smear 03/23/2018   Goiter 08/13/2016   History of ulcerative colitis 08/13/2016   Tobacco use 08/13/2016   Dyslipidemia 08/13/2016   Personal history of other malignant neoplasm of skin 01/03/2014    Past Surgical History:  Procedure Laterality Date   COLONOSCOPY WITH PROPOFOL N/A 09/26/2023   Procedure: COLONOSCOPY WITH PROPOFOL;  Surgeon: Toney Reil, MD;  Location[facility]OSCOPY;  Service: Gastroenterology;  Laterality: N/A;   COLPOSCOPY W/ BIOPSY / CURETTAGE     Cone biopsy   04/2018   clear margins    MOHS SURGERY  2014   Face    Family History  Problem Relation Age of Onset   Cancer Mother        colon   Cancer Father        Lung   Cancer Sister        pancreatic   Cancer Sister        skin   Cancer Brother        Lung    Social History   Tobacco Use   Smoking status: Former    Current packs/day: 0.00    Average packs/day: 0.2 packs/day for 46.3 years (  6.9 ttl pk-yrs)    Types: Cigarettes    Start date: 08/13/1976    Quit date: 11/17/2022    Years since quitting: 0.8   Smokeless tobacco: Never  Substance Use Topics   Alcohol use: Yes    Alcohol/week: 1.0 standard drink of alcohol    Types: 1 Glasses of wine per week     Current Outpatient Medications:    Ascorbic Acid (VITAMIN C) 1000 MG tablet, Take 4,000 mg by mouth daily., Disp: , Rfl:    B Complex Vitamins (VITAMIN B COMPLEX PO), Take by mouth., Disp: , Rfl:    Biotin 56213 MCG TABS, Take by mouth., Disp: , Rfl:    Cholecalciferol (VITAMIN D) 50 MCG (2000 UT) CAPS, Take 1 capsule by mouth daily at 12 noon., Disp: , Rfl:     senna-docusate (SENNA S) 8.6-50 MG tablet, , Disp: , Rfl:    ST JOHNS WORT PO, Take by mouth daily., Disp: , Rfl:    traZODone (DESYREL) 50 MG tablet, Take 1 tablet (50 mg total) by mouth at bedtime., Disp: 90 tablet, Rfl: 3   Turmeric (QC TUMERIC COMPLEX PO), Take by mouth., Disp: , Rfl:   No Known Allergies  I personally reviewed active problem list, medication list, allergies, family history, social history, health maintenance with the patient/caregiver today.   ROS  Ten systems reviewed and is negative except as mentioned in HPI    Objective  Vitals:   10/09/23 1055 10/09/23 1103  BP: (!) 168/96 (!) 164/100  Pulse: 87   Resp: 16   Temp: 97.9 F (36.6 C)   TempSrc: Oral   SpO2: 99%   Weight: 219 lb 11.2 oz (99.7 kg)   Height: 5\' 8"  (1.727 m)     Body mass index is 33.41 kg/m.  Physical Exam  Constitutional: Patient appears well-developed and well-nourished. Obese  No distress.  HEENT: head atraumatic, normocephalic, pupils equal and reactive to light, neck supple Cardiovascular: Normal rate, regular rhythm and normal heart sounds.  No murmur heard. No BLE edema. Pulmonary/Chest: Effort normal and breath sounds normal. No respiratory distress. Abdominal: Soft.  There is no tenderness. Psychiatric: Patient has a normal mood and affect. behavior is normal. Judgment and thought content normal.    PHQ2/9:    10/09/2023   10:54 AM 01/01/2023    2:21 PM 01/01/2022   12:57 PM 12/06/2020    3:02 PM 10/27/2020    3:39 PM  Depression screen PHQ 2/9  Decreased Interest 0 0 1 0 0  Down, Depressed, Hopeless 0 0 0 1 0  PHQ - 2 Score 0 0 1 1 0  Altered sleeping 0 0 0 1   Tired, decreased energy 0 0 0 1   Change in appetite 0 0 0 0   Feeling bad or failure about yourself  0 0 0 1   Trouble concentrating 0 0 0 0   Moving slowly or fidgety/restless 0 0 0 0   Suicidal thoughts 0 0 0 0   PHQ-9 Score 0 0 1 4   Difficult doing work/chores Not difficult at all  Not difficult at  all Not difficult at all     phq 9 is negative   Fall Risk:    10/09/2023   10:54 AM 01/01/2023    2:21 PM 01/01/2022   12:56 PM 12/06/2020    3:02 PM 10/27/2020    3:26 PM  Fall Risk   Falls in the past year? 0 0 0  0  Number falls in past yr: 0 0 0 0 0  Injury with Fall? 0 0 0 0 0  Risk for fall due to : No Fall Risks No Fall Risks No Fall Risks    Follow up Falls prevention discussed;Education provided;Falls evaluation completed Falls prevention discussed Falls prevention discussed        Functional Status Survey: Is the patient deaf or have difficulty hearing?: No Does the patient have difficulty seeing, even when wearing glasses/contacts?: No Does the patient have difficulty concentrating, remembering, or making decisions?: No Does the patient have difficulty walking or climbing stairs?: No Does the patient have difficulty dressing or bathing?: No Does the patient have difficulty doing errands alone such as visiting a doctor's office or shopping?: No    Assessment & Plan  Assessment and Plan    Hypertension Recent significant increase in blood pressure readings, with a history of well-controlled hypertension. Patient reports increased physical activity and dietary changes. No clear precipitating factors identified. -Start Hydrochlorothiazide (HCTZ) for blood pressure control. -Check blood pressure in 1 week to assess response to medication.  Anxiety/Depression Patient reports long-standing anxiety and depression, with a history of using Xanax. Son suggests Wellbutrin, but concern for potential blood pressure increase. -Start Buspirone (Buspar) as needed for anxiety. -Discuss potential for adding Duloxetine or Lexapro at a later date if needed.  Weight Gain Patient reports weight gain despite increased physical activity and dietary changes. Recent A1C was 6.1, close to the diabetes range. -Check A1C and other labs to assess for potential diabetes or other metabolic  issues contributing to weight gain.  Cardiovascular Health Given the significant increase in blood pressure, an EKG was performed which showed normal sinus rhythm with a heart rate of 66. -Continue monitoring cardiovascular health, particularly in the context of the new hypertension treatment regimen.  General Health Maintenance -Flu shot confirmed as received. -Continue to encourage cessation of energy drinks and limit caffeine intake.

## 2023-10-09 ENCOUNTER — Ambulatory Visit (INDEPENDENT_AMBULATORY_CARE_PROVIDER_SITE_OTHER): Payer: Commercial Managed Care - PPO | Admitting: Family Medicine

## 2023-10-09 ENCOUNTER — Encounter: Payer: Self-pay | Admitting: Family Medicine

## 2023-10-09 VITALS — BP 164/100 | HR 87 | Temp 97.9°F | Resp 16 | Ht 68.0 in | Wt 219.7 lb

## 2023-10-09 DIAGNOSIS — I700 Atherosclerosis of aorta: Secondary | ICD-10-CM | POA: Diagnosis not present

## 2023-10-09 DIAGNOSIS — E049 Nontoxic goiter, unspecified: Secondary | ICD-10-CM | POA: Diagnosis not present

## 2023-10-09 DIAGNOSIS — R739 Hyperglycemia, unspecified: Secondary | ICD-10-CM

## 2023-10-09 DIAGNOSIS — F419 Anxiety disorder, unspecified: Secondary | ICD-10-CM

## 2023-10-09 DIAGNOSIS — I10 Essential (primary) hypertension: Secondary | ICD-10-CM

## 2023-10-09 MED ORDER — HYDROCHLOROTHIAZIDE 12.5 MG PO TABS: 12.5000 mg | ORAL_TABLET | Freq: Every day | ORAL | 0 refills | Status: DC

## 2023-10-09 MED ORDER — BUSPIRONE HCL 5 MG PO TABS: 5.0000 mg | ORAL_TABLET | Freq: Three times a day (TID) | ORAL | 0 refills | Status: DC | PRN

## 2023-10-09 MED ORDER — ATENOLOL-CHLORTHALIDONE 50-25 MG PO TABS: 0.5000 | ORAL_TABLET | Freq: Every day | ORAL | 0 refills | Status: DC

## 2023-10-10 LAB — CBC WITH DIFFERENTIAL/PLATELET
Absolute Lymphocytes: 3582 {cells}/uL (ref 850–3900)
Absolute Monocytes: 599 {cells}/uL (ref 200–950)
Basophils Absolute: 76 {cells}/uL (ref 0–200)
Basophils Relative: 0.8 %
Eosinophils Absolute: 190 {cells}/uL (ref 15–500)
Eosinophils Relative: 2 %
HCT: 44 % (ref 35.0–45.0)
Hemoglobin: 14.6 g/dL (ref 11.7–15.5)
MCH: 28.9 pg (ref 27.0–33.0)
MCHC: 33.2 g/dL (ref 32.0–36.0)
MCV: 87.1 fL (ref 80.0–100.0)
MPV: 10.8 fL (ref 7.5–12.5)
Monocytes Relative: 6.3 %
Neutro Abs: 5054 {cells}/uL (ref 1500–7800)
Neutrophils Relative %: 53.2 %
Platelets: 278 10*3/uL (ref 140–400)
RBC: 5.05 10*6/uL (ref 3.80–5.10)
RDW: 13.3 % (ref 11.0–15.0)
Total Lymphocyte: 37.7 %
WBC: 9.5 10*3/uL (ref 3.8–10.8)

## 2023-10-10 LAB — COMPLETE METABOLIC PANEL WITH GFR
AG Ratio: 1.8 (calc) (ref 1.0–2.5)
ALT: 32 U/L — ABNORMAL HIGH (ref 6–29)
AST: 20 U/L (ref 10–35)
Albumin: 4.6 g/dL (ref 3.6–5.1)
Alkaline phosphatase (APISO): 105 U/L (ref 37–153)
BUN: 14 mg/dL (ref 7–25)
CO2: 28 mmol/L (ref 20–32)
Calcium: 9.5 mg/dL (ref 8.6–10.4)
Chloride: 102 mmol/L (ref 98–110)
Creat: 0.78 mg/dL (ref 0.50–1.03)
Globulin: 2.5 g/dL (ref 1.9–3.7)
Glucose, Bld: 94 mg/dL (ref 65–99)
Potassium: 4.1 mmol/L (ref 3.5–5.3)
Sodium: 140 mmol/L (ref 135–146)
Total Bilirubin: 0.4 mg/dL (ref 0.2–1.2)
Total Protein: 7.1 g/dL (ref 6.1–8.1)
eGFR: 87 mL/min/{1.73_m2} (ref >=60)

## 2023-10-10 LAB — HEMOGLOBIN A1C
Hgb A1c MFr Bld: 6.1 %{Hb} — ABNORMAL HIGH (ref <5.7)
Mean Plasma Glucose: 128 mg/dL
eAG (mmol/L): 7.1 mmol/L

## 2023-10-10 LAB — MICROALBUMIN / CREATININE URINE RATIO
Creatinine, Urine: 17 mg/dL — ABNORMAL LOW (ref 20–275)
Microalb Creat Ratio: 12 mg/g{creat} (ref <30)
Microalb, Ur: 0.2 mg/dL

## 2023-10-10 LAB — TSH: TSH: 1.1 m[IU]/L (ref 0.40–4.50)

## 2023-10-27 ENCOUNTER — Other Ambulatory Visit: Payer: Self-pay | Admitting: Family Medicine

## 2023-10-27 DIAGNOSIS — F5104 Psychophysiologic insomnia: Secondary | ICD-10-CM

## 2023-10-28 ENCOUNTER — Ambulatory Visit: Payer: Commercial Managed Care - PPO

## 2023-10-28 NOTE — Progress Notes (Signed)
Patient is in office today for a nurse visit for Blood Pressure Check. Patient blood pressure was 142/88, Patient No chest pain, No shortness of breath, No dyspnea on exertion, No orthopnea, No paroxysmal nocturnal dyspnea, No edema, No palpitations, No syncope.  Per last office visit Hypertension Recent significant increase in blood pressure readings, with a history of well-controlled hypertension. Patient reports increased physical activity and dietary changes. No clear precipitating factors identified. -Start Hydrochlorothiazide (HCTZ) for blood pressure control. -Check blood pressure in 1 week to assess response to medication.  Pt states was only taking half a pill will increase to whole, has a f/u appointment in January.

## 2023-10-29 ENCOUNTER — Encounter: Payer: Self-pay | Admitting: Family Medicine

## 2023-11-03 ENCOUNTER — Ambulatory Visit: Payer: Commercial Managed Care - PPO | Admitting: Family Medicine

## 2023-11-03 VITALS — BP 158/82 | HR 68 | Temp 98.1°F | Resp 16 | Ht 68.0 in | Wt 221.7 lb

## 2023-11-03 DIAGNOSIS — I10 Essential (primary) hypertension: Secondary | ICD-10-CM

## 2023-11-03 DIAGNOSIS — I700 Atherosclerosis of aorta: Secondary | ICD-10-CM

## 2023-11-03 DIAGNOSIS — E66811 Obesity, class 1: Secondary | ICD-10-CM

## 2023-11-03 MED ORDER — OLMESARTAN MEDOXOMIL-HCTZ 20-12.5 MG PO TABS: 1.0000 | ORAL_TABLET | Freq: Every day | ORAL | 0 refills | Status: DC

## 2023-11-03 MED ORDER — ZEPBOUND 2.5 MG/0.5ML ~~LOC~~ SOAJ: 2.5000 mg | SUBCUTANEOUS | 0 refills | Status: DC

## 2023-11-03 NOTE — Progress Notes (Signed)
Name: Jennifer Oconnell   MRN: [id number]    DOB: [birth year]   Date:11/03/2023       Progress Note  Subjective  Chief Complaint  Chief Complaint  Patient presents with   Weight Loss    Pt has not called insurance yet what they cover   Hypertension    High readings at home average 140s/80s    HPI Discussed the use of AI scribe software for clinical note transcription with the patient, who gave verbal consent to proceed.  Discussed the use of AI scribe software for clinical note transcription with the patient, who gave verbal consent to proceed.  History of Present Illness   The patient, a nurse with a history of hypertension, presented with concerns about elevated blood pressure readings despite lifestyle modifications. She reported a recent increase in blood pressure to 160 over an unspecified value, which was alarming given her recent efforts to improve her diet, reduce smoking and alcohol consumption, and increase physical activity. The patient expressed frustration over the unexpected rise in blood pressure, which led her to seek medical attention.  The patient's blood pressure was previously well-controlled, with a reading of 122/70 in February. However, by November, her systolic blood pressure had increased significantly. The patient also reported a weight gain of 10 pounds over the past year, which she acknowledged could be contributing to her hypertension.  The patient has a family history of hypertension, with all her siblings also having high blood pressure. She is currently on 12.5mg  of HCTZ, which she takes daily. However, she reported that her blood pressure remains elevated at 158/82, despite the medication.  In addition to hypertension, the patient is in the prediabetes range with an A1c of 6.1. She also has atherosclerosis of the aorta. The patient is a daily smoker, consuming two to three cigarettes a day, and also drinks alcohol daily. She expressed a desire to improve her  health by losing weight, quitting smoking, and reducing alcohol consumption.  The patient also reported daily alcohol consumption in the form of a flavored liquor drink with 15% alcohol content. She acknowledged that this habit, along with her daily smoking, needs to be addressed to improve her overall health. The patient expressed a commitment to making these lifestyle changes after her upcoming birthday.         Patient Active Problem List   Diagnosis Date Noted   Screening for colon cancer 09/26/2023   Aortic atherosclerosis (HCC) 12/26/2020   Dysthymia 09/15/2019   Overweight (BMI 25.0-29.9) 07/29/2018   Anxiety 07/29/2018   History of abnormal cervical Pap smear 03/23/2018   Goiter 08/13/2016   History of ulcerative colitis 08/13/2016   Tobacco use 08/13/2016   Dyslipidemia 08/13/2016   Personal history of other malignant neoplasm of skin 01/03/2014    Past Surgical History:  Procedure Laterality Date   COLONOSCOPY WITH PROPOFOL N/A 09/26/2023   Procedure: COLONOSCOPY WITH PROPOFOL;  Surgeon: Toney Reil, MD;  Location: [facility] ENDOSCOPY;  Service: Gastroenterology;  Laterality: N/A;   COLPOSCOPY W/ BIOPSY / CURETTAGE     [facility] biopsy   04/2018   clear margins    MOHS SURGERY  2014   Face    Family History  Problem Relation Age of Onset   Cancer Mother        colon   Cancer Father        Lung   Cancer Sister        pancreatic   Cancer Sister  skin   Cancer Brother        Lung    Social History   Tobacco Use   Smoking status: Former    Current packs/day: 0.00    Average packs/day: 0.2 packs/day for 46.3 years (6.9 ttl pk-yrs)    Types: Cigarettes    Start date: 08/13/1976    Quit date: 11/17/2022    Years since quitting: 0.9   Smokeless tobacco: Never  Substance Use Topics   Alcohol use: Yes    Alcohol/week: 1.0 standard drink of alcohol    Types: 1 Glasses of wine per week     Current Outpatient Medications:    Ascorbic Acid (VITAMIN C)  1000 MG tablet, Take 4,000 mg by mouth daily., Disp: , Rfl:    busPIRone (BUSPAR) 5 MG tablet, Take 1-2 tablets (5-10 mg total) by mouth 3 (three) times daily as needed., Disp: 90 tablet, Rfl: 0   Cholecalciferol (VITAMIN D) 50 MCG (2000 UT) CAPS, Take 1 capsule by mouth daily at 12 noon., Disp: , Rfl:    GARLIC 1500 PO, , Disp: , Rfl:    hydrochlorothiazide (HYDRODIURIL) 12.5 MG tablet, Take 1 tablet (12.5 mg total) by mouth daily., Disp: 30 tablet, Rfl: 0   ST JOHNS WORT PO, Take by mouth daily., Disp: , Rfl:    traZODone (DESYREL) 50 MG tablet, TAKE 1 TABLET BY MOUTH AT BEDTIME, Disp: 90 tablet, Rfl: 0   Turmeric (QC TUMERIC COMPLEX PO), Take by mouth., Disp: , Rfl:    B Complex Vitamins (VITAMIN B COMPLEX PO), Take by mouth., Disp: , Rfl:    Biotin 30865 MCG TABS, Take by mouth., Disp: , Rfl:    senna-docusate (SENNA S) 8.6-50 MG tablet, , Disp: , Rfl:   No Known Allergies  I personally reviewed active problem list, medication list, allergies, family history with the patient/caregiver today.   ROS  Ten systems reviewed and is negative except as mentioned in HPI    Objective  Vitals:   11/03/23 1542  BP: (!) 158/82  Pulse: 68  Resp: 16  Temp: 98.1 F (36.7 C)  TempSrc: Oral  SpO2: 97%  Weight: 221 lb 11.2 oz (100.6 kg)  Height: 5\' 8"  (1.727 m)    Body mass index is 33.71 kg/m.  Physical Exam  Constitutional: Patient appears well-developed and well-nourished. Obese  No distress.  HEENT: head atraumatic, normocephalic, pupils equal and reactive to light, neck supple Cardiovascular: Normal rate, regular rhythm and normal heart sounds.  No murmur heard. No BLE edema. Pulmonary/Chest: Effort normal and breath sounds normal. No respiratory distress. Abdominal: Soft.  There is no tenderness. Psychiatric: Patient has a normal mood and affect. behavior is normal. Judgment and thought content normal.   Recent Results (from the past 2160 hours)  Hemoglobin A1c     Status:  Abnormal   Collection Time: 10/09/23 11:33 AM  Result Value Ref Range   Hgb A1c MFr Bld 6.1 (H) <5.7 % of total Hgb    Comment: For someone without known diabetes, a hemoglobin  A1c value between 5.7% and 6.4% is consistent with prediabetes and should be confirmed with a  follow-up test. . For someone with known diabetes, a value <7% indicates that their diabetes is well controlled. A1c targets should be individualized based on duration of diabetes, age, comorbid conditions, and other considerations. . This assay result is consistent with an increased risk of diabetes. . Currently, no consensus exists regarding use of hemoglobin A1c for diagnosis of diabetes for  children. .    Mean Plasma Glucose 128 mg/dL   eAG (mmol/L) 7.1 mmol/L  Microalbumin / creatinine urine ratio     Status: Abnormal   Collection Time: 10/09/23 11:33 AM  Result Value Ref Range   Creatinine, Urine 17 (L) 20 - 275 mg/dL   Microalb, Ur 0.2 mg/dL    Comment: Reference Range Not established    Microalb Creat Ratio 12 <30 mg/g creat    Comment: . The ADA defines abnormalities in albumin excretion as follows: Marland Kitchen Albuminuria Category        Result (mg/g creatinine) . Normal to Mildly increased   <30 Moderately increased         30-299  Severely increased           > OR = 300 . The ADA recommends that at least two of three specimens collected within a 3-6 month period be abnormal before considering a patient to be within a diagnostic category.   COMPLETE METABOLIC PANEL WITH GFR     Status: Abnormal   Collection Time: 10/09/23 11:33 AM  Result Value Ref Range   Glucose, Bld 94 65 - 99 mg/dL    Comment: .            Fasting reference interval .    BUN 14 7 - 25 mg/dL   Creat 1.61 0.96 - 0.45 mg/dL   eGFR 87 > OR = 60 WU/JWJ/1.91Y7   BUN/Creatinine Ratio SEE NOTE: 6 - 22 (calc)    Comment:    Not Reported: BUN and Creatinine are within    reference range. .    Sodium 140 135 - 146 mmol/L    Potassium 4.1 3.5 - 5.3 mmol/L   Chloride 102 98 - 110 mmol/L   CO2 28 20 - 32 mmol/L   Calcium 9.5 8.6 - 10.4 mg/dL   Total Protein 7.1 6.1 - 8.1 g/dL   Albumin 4.6 3.6 - 5.1 g/dL   Globulin 2.5 1.9 - 3.7 g/dL (calc)   AG Ratio 1.8 1.0 - 2.5 (calc)   Total Bilirubin 0.4 0.2 - 1.2 mg/dL   Alkaline phosphatase (APISO) 105 37 - 153 U/L   AST 20 10 - 35 U/L   ALT 32 (H) 6 - 29 U/L  CBC with Differential/Platelet     Status: None   Collection Time: 10/09/23 11:33 AM  Result Value Ref Range   WBC 9.5 3.8 - 10.8 Thousand/uL   RBC 5.05 3.80 - 5.10 Million/uL   Hemoglobin 14.6 11.7 - 15.5 g/dL   HCT 82.9 56.2 - 13.0 %   MCV 87.1 80.0 - 100.0 fL   MCH 28.9 27.0 - 33.0 pg   MCHC 33.2 32.0 - 36.0 g/dL    Comment: For adults, a slight decrease in the calculated MCHC value (in the range of 30 to 32 g/dL) is most likely not clinically significant; however, it should be interpreted with caution in correlation with other red cell parameters and the patient's clinical condition.    RDW 13.3 11.0 - 15.0 %   Platelets 278 140 - 400 Thousand/uL   MPV 10.8 7.5 - 12.5 fL   Neutro Abs 5,054 1,500 - 7,800 cells/uL   Absolute Lymphocytes 3,582 850 - 3,900 cells/uL   Absolute Monocytes 599 200 - 950 cells/uL   Eosinophils Absolute 190 15 - 500 cells/uL   Basophils Absolute 76 0 - 200 cells/uL   Neutrophils Relative % 53.2 %   Total Lymphocyte 37.7 %   Monocytes  Relative 6.3 %   Eosinophils Relative 2.0 %   Basophils Relative 0.8 %  TSH     Status: None   Collection Time: 10/09/23 11:33 AM  Result Value Ref Range   TSH 1.10 0.40 - 4.50 mIU/L    Diabetic Foot Exam:     PHQ2/9:    11/03/2023    3:41 PM 10/09/2023   10:54 AM 01/01/2023    2:21 PM 01/01/2022   12:57 PM 12/06/2020    3:02 PM  Depression screen PHQ 2/9  Decreased Interest 0 0 0 1 0  Down, Depressed, Hopeless 0 0 0 0 1  PHQ - 2 Score 0 0 0 1 1  Altered sleeping 0 0 0 0 1  Tired, decreased energy 0 0 0 0 1  Change in  appetite 0 0 0 0 0  Feeling bad or failure about yourself  0 0 0 0 1  Trouble concentrating 0 0 0 0 0  Moving slowly or fidgety/restless 0 0 0 0 0  Suicidal thoughts 0 0 0 0 0  PHQ-9 Score 0 0 0 1 4  Difficult doing work/chores Not difficult at all Not difficult at all  Not difficult at all Not difficult at all    phq 9 is negative   Assessment & Plan  Assessment and Plan    Hypertension Uncontrolled blood pressure (158/82) despite HCTZ 12.5mg  daily. Discussed the importance of lifestyle modifications including weight loss, reduced salt intake, and cessation of smoking and alcohol. Family history of hypertension noted. -Change from HCTZ to Benicar HCTZ. -Check basic metabolic panel on follow-up visit on 12/01/2023 to monitor kidney function. Discussed renal US but patient wants to hold off for now  Prediabetes A1c of 6.1, stable from previous measurement. Discussed the importance of weight loss and lifestyle modifications. -Consider starting GLP-1 agonist (Zepbound) for weight loss if covered by insurance.  Tobacco and Alcohol Use Daily use of tobacco (2-3 cigarettes) and alcohol (15% alcohol content drink). Discussed the importance of cessation for overall health and blood pressure control. -Encouraged to quit smoking and reduce alcohol intake.  Weight Management, Obesity  Weight gain of 10 pounds over the past year. Discussed the importance of weight loss for blood pressure control and overall health. -Encouraged to increase physical activity and improve diet. -Consider weight loss medication (Zepbound) if covered by insurance.  Follow-up in 1 month (12/01/2023) to monitor blood pressure, kidney function, and discuss progress with lifestyle modifications.

## 2023-11-04 ENCOUNTER — Encounter: Payer: Self-pay | Admitting: Family Medicine

## 2023-11-24 ENCOUNTER — Ambulatory Visit: Payer: Commercial Managed Care - PPO | Admitting: Family Medicine

## 2023-11-30 ENCOUNTER — Other Ambulatory Visit: Payer: Self-pay | Admitting: Family Medicine

## 2023-11-30 DIAGNOSIS — I10 Essential (primary) hypertension: Secondary | ICD-10-CM

## 2023-12-01 ENCOUNTER — Ambulatory Visit (INDEPENDENT_AMBULATORY_CARE_PROVIDER_SITE_OTHER): Payer: Commercial Managed Care - PPO | Admitting: Family Medicine

## 2023-12-01 ENCOUNTER — Encounter: Payer: Self-pay | Admitting: Family Medicine

## 2023-12-01 VITALS — BP 132/84 | HR 94 | Resp 16 | Ht 68.0 in | Wt 222.4 lb

## 2023-12-01 DIAGNOSIS — E66811 Obesity, class 1: Secondary | ICD-10-CM

## 2023-12-01 DIAGNOSIS — I10 Essential (primary) hypertension: Secondary | ICD-10-CM

## 2023-12-01 DIAGNOSIS — M238X1 Other internal derangements of right knee: Secondary | ICD-10-CM

## 2023-12-01 DIAGNOSIS — F419 Anxiety disorder, unspecified: Secondary | ICD-10-CM | POA: Diagnosis not present

## 2023-12-01 IMAGING — MG MM DIGITAL SCREENING BILAT W/ TOMO AND CAD
8 series · 8 of 24 positions shown · non-contrast
Comparison: Previous exam(s).

CLINICAL DATA: Screening.

EXAM:
DIGITAL SCREENING BILATERAL MAMMOGRAM WITH TOMOSYNTHESIS AND CAD
TECHNIQUE: Bilateral screening digital craniocaudal and mediolateral oblique
mammograms were obtained. Bilateral screening digital breast
tomosynthesis was performed. The images were evaluated with
computer-aided detection.

[R MLO synth-2D]
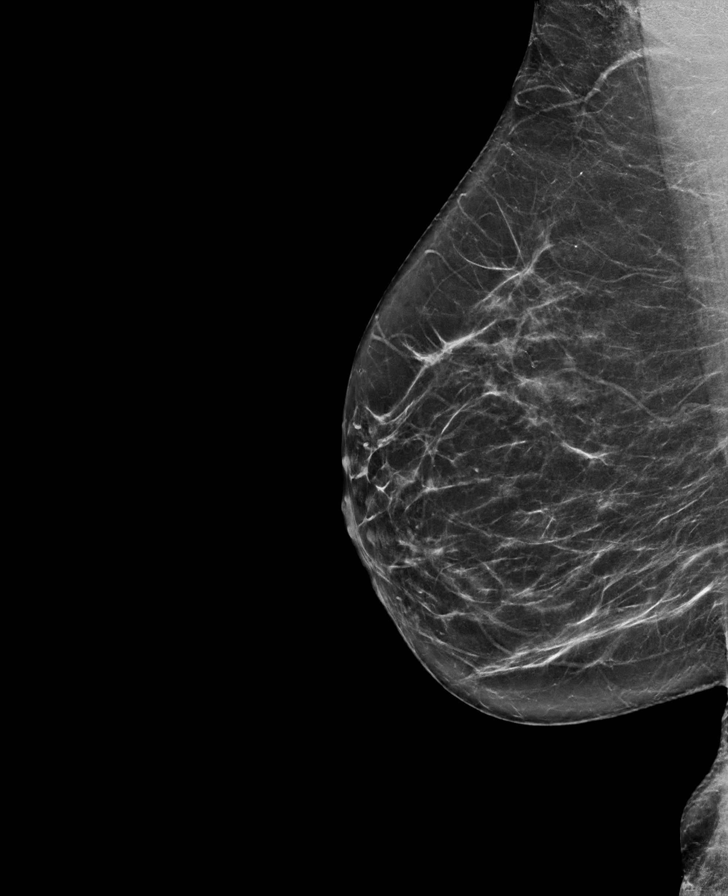

[R CC synth-2D]
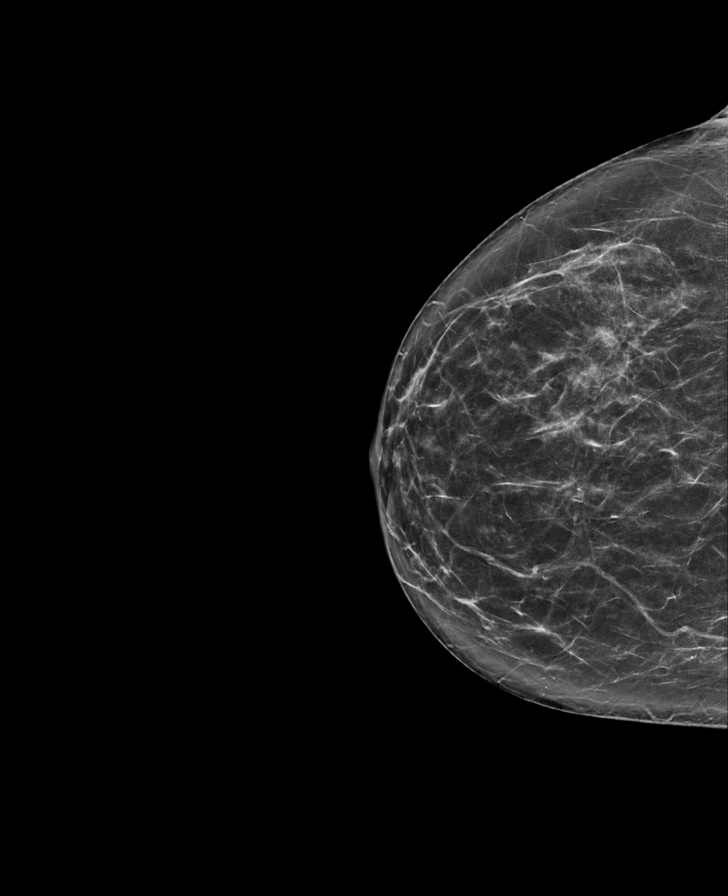

[L CC synth-2D]
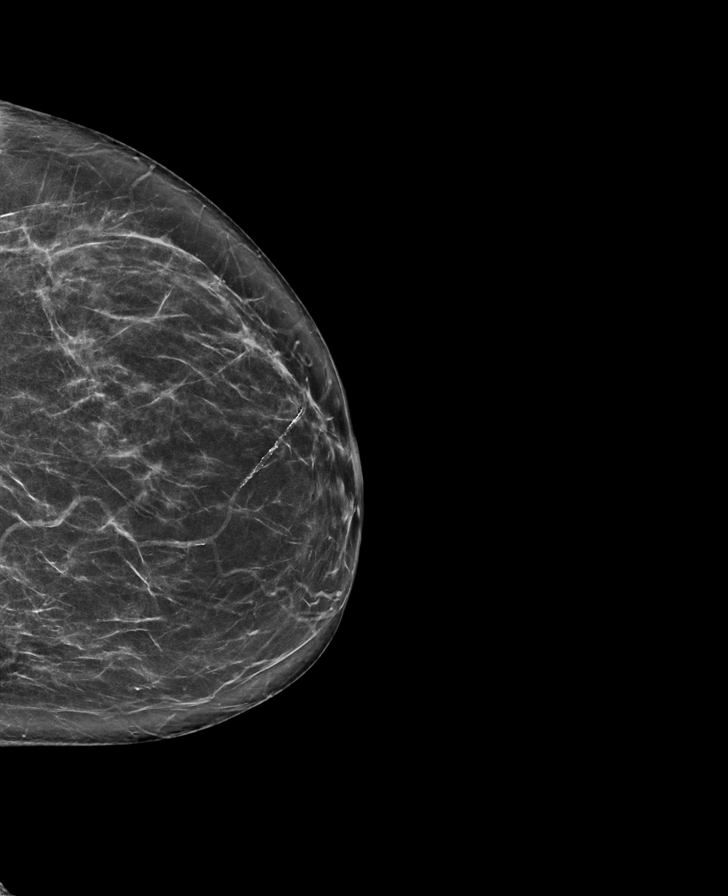

[L MLO synth-2D]
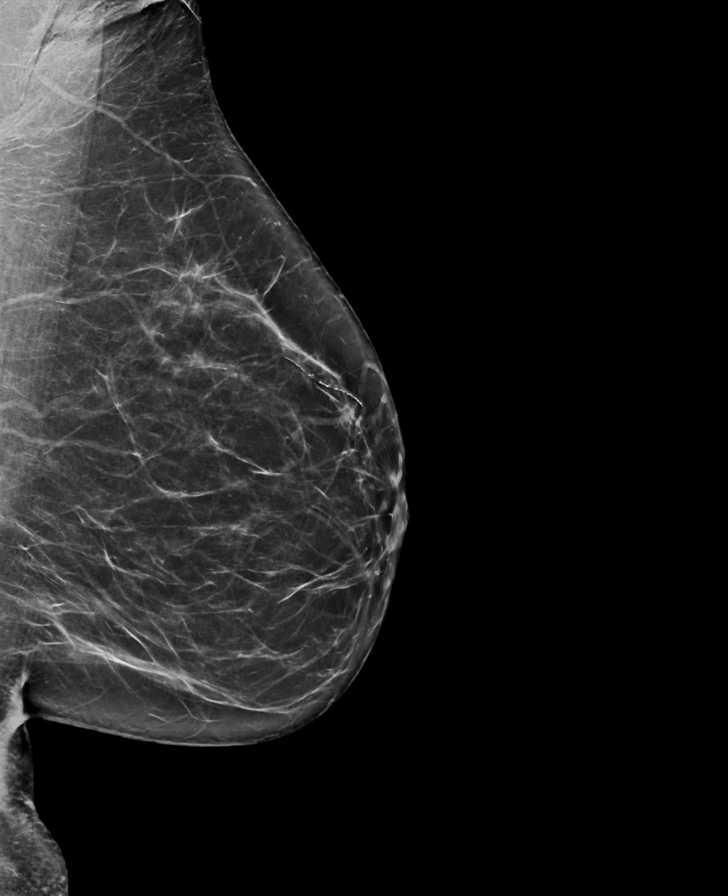

[L MLO tomo · tomo slice 39/77.0]
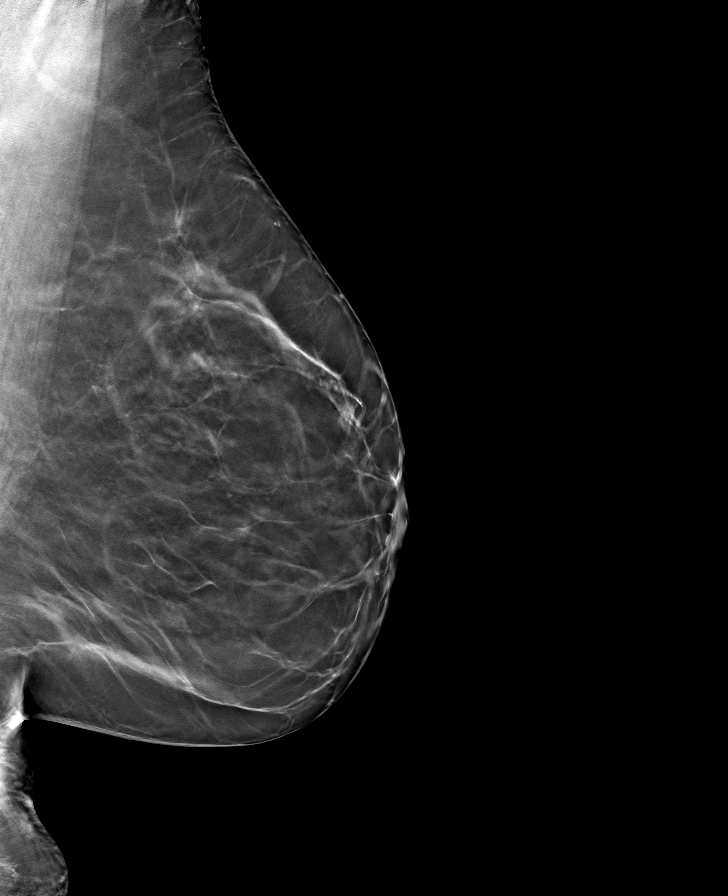

[R CC tomo · tomo slice 36/71.0]
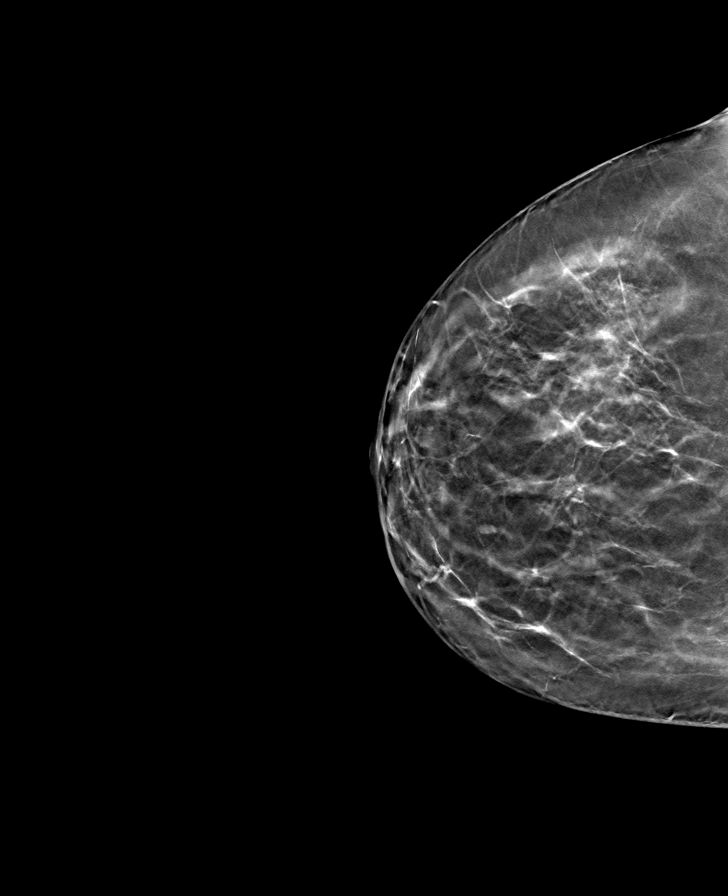

[L CC tomo · tomo slice 37/74.0]
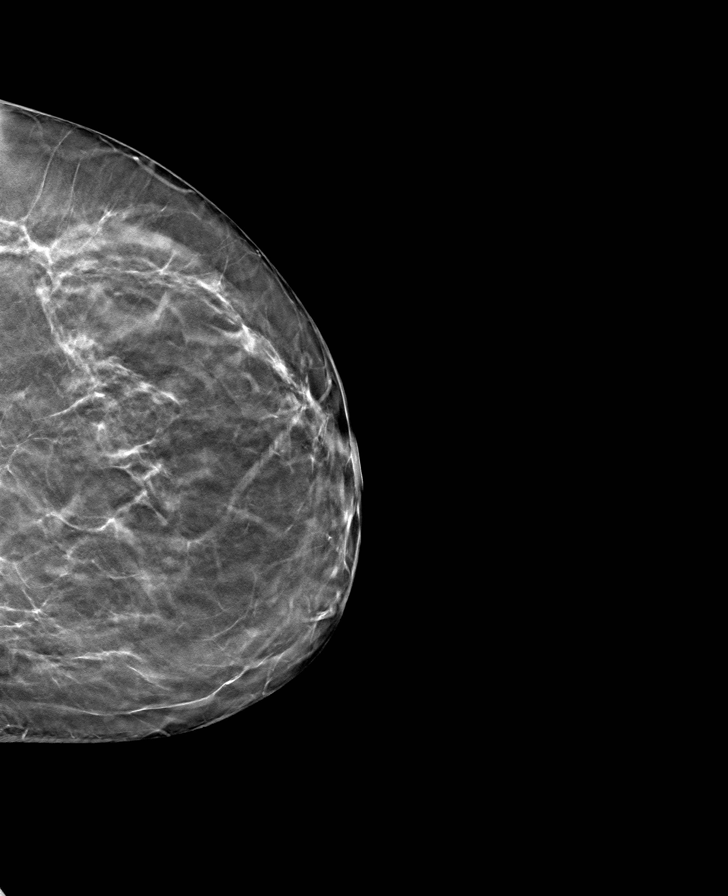

[R MLO tomo · tomo slice 37/72.0]
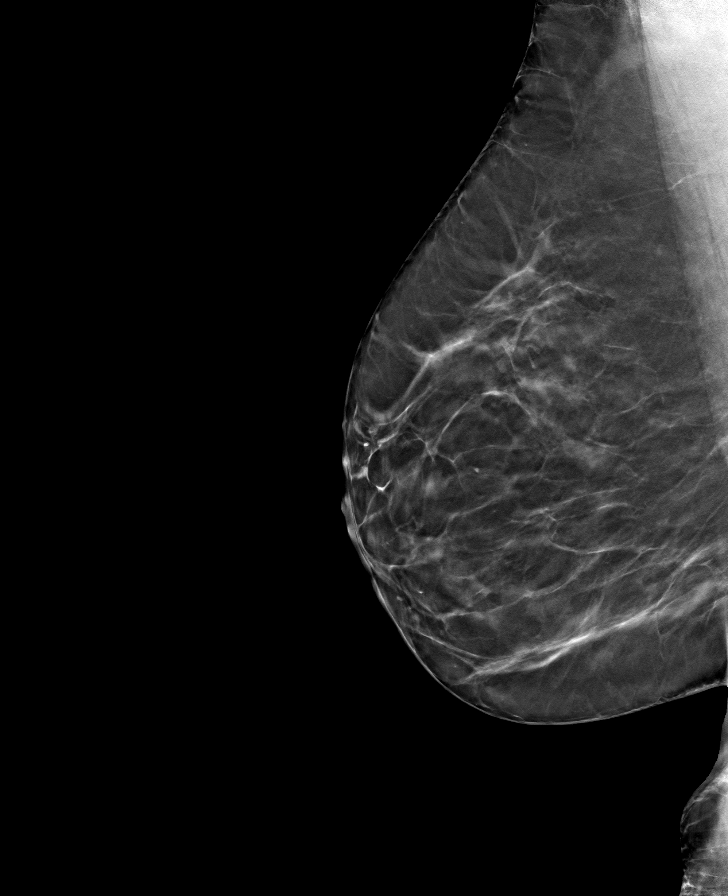

[8 of 24 positions shown; findings below may reference images not displayed]

ACR Breast Density Category b: There are scattered areas of
fibroglandular density.
FINDINGS: There are no findings suspicious for malignancy.
IMPRESSION: No mammographic evidence of malignancy. A result letter of this
screening mammogram will be mailed directly to the patient.

RECOMMENDATION:
Screening mammogram in one year. (Code:51-O-LD2)

BI-RADS CATEGORY  1: Negative.

## 2023-12-01 MED ORDER — OLMESARTAN MEDOXOMIL-HCTZ 40-12.5 MG PO TABS: 1.0000 | ORAL_TABLET | Freq: Every day | ORAL | 0 refills | Status: DC

## 2023-12-01 NOTE — Progress Notes (Signed)
 Name: Jennifer Oconnell   MRN: [id number]    DOB: October 21, 1961   Date:12/01/2023       Progress Note  Subjective  Chief Complaint  Chief Complaint  Patient presents with   Medical Management of Chronic Issues    HTN- still high when pt checks it at work systolic around150s    HPI  Discussed the use of AI scribe software for clinical note transcription with the patient, who gave verbal consent to proceed.  History of Present Illness   The patient, with a history of hypertension, presented for a follow-up visit after starting on a combination of Olmesartan  and HCTZ. She reported that the medication has been effective, but her blood pressure readings at home have been fluctuating between 130 and 140. The patient expressed a desire to delay any medication adjustments due to ongoing efforts to lose weight. She reported a recent weight loss of approximately 7 pounds through diet and exercise, including cycling and using an exercise app.  In addition to hypertension, the patient is managing her mental health with Buspar , taking a half dose of 5mg  daily. She reported no issues with this regimen.  The patient also mentioned experiencing crepitus in her right knee, which she attributed to a previous fall. She reported occasional discomfort and a sensation of something not being right in the knee, but denied any instability, redness, or swelling. She has been advised by family to be cautious due to the possibility of bone-on-bone contact. Despite this, she has been maintaining an active lifestyle, including walking and cycling.        Patient Active Problem List   Diagnosis Date Noted   Screening for colon cancer 09/26/2023   Aortic atherosclerosis (HCC) 12/26/2020   Dysthymia 09/15/2019   Overweight (BMI 25.0-29.9) 07/29/2018   Anxiety 07/29/2018   History of abnormal cervical Pap smear 03/23/2018   Goiter 08/13/2016   History of ulcerative colitis 08/13/2016   Tobacco use 08/13/2016    Dyslipidemia 08/13/2016   Personal history of other malignant neoplasm of skin 01/03/2014    Past Surgical History:  Procedure Laterality Date   COLONOSCOPY WITH PROPOFOL  N/A 09/26/2023   Procedure: COLONOSCOPY WITH PROPOFOL ;  Surgeon: Unk Corinn Skiff, MD;  Location: [facility] ENDOSCOPY;  Service: Gastroenterology;  Laterality: N/A;   COLPOSCOPY W/ BIOPSY / CURETTAGE     [facility] biopsy   04/2018   clear margins    MOHS SURGERY  2014   Face    Family History  Problem Relation Age of Onset   Cancer Mother        colon   Cancer Father        Lung   Cancer Sister        pancreatic   Cancer Sister        skin   Cancer Brother        Lung    Social History   Tobacco Use   Smoking status: Former    Current packs/day: 0.00    Average packs/day: 0.2 packs/day for 46.3 years (6.9 ttl pk-yrs)    Types: Cigarettes    Start date: 08/13/1976    Quit date: 11/17/2022    Years since quitting: 1.0   Smokeless tobacco: Never  Substance Use Topics   Alcohol use: Yes    Alcohol/week: 1.0 standard drink of alcohol    Types: 1 Glasses of wine per week     Current Outpatient Medications:    Ascorbic Acid (VITAMIN C) 1000 MG tablet, Take 4,000  mg by mouth daily., Disp: , Rfl:    busPIRone  (BUSPAR ) 5 MG tablet, Take 1-2 tablets (5-10 mg total) by mouth 3 (three) times daily as needed., Disp: 90 tablet, Rfl: 0   Cholecalciferol (VITAMIN D) 50 MCG (2000 UT) CAPS, Take 1 capsule by mouth daily at 12 noon., Disp: , Rfl:    GARLIC 1500 PO, , Disp: , Rfl:    olmesartan -hydrochlorothiazide  (BENICAR  HCT) 40-12.5 MG tablet, Take 1 tablet by mouth daily., Disp: 90 tablet, Rfl: 0   ST JOHNS WORT PO, Take by mouth daily., Disp: , Rfl:    tirzepatide  (ZEPBOUND ) 2.5 MG/0.5ML Pen, Inject 2.5 mg into the skin once a week., Disp: 2 mL, Rfl: 0   traZODone  (DESYREL ) 50 MG tablet, TAKE 1 TABLET BY MOUTH AT BEDTIME, Disp: 90 tablet, Rfl: 0   Turmeric (QC TUMERIC COMPLEX PO), Take by mouth., Disp: , Rfl:     senna-docusate (SENNA S) 8.6-50 MG tablet, , Disp: , Rfl:   No Known Allergies  I personally reviewed active problem list, medication list, allergies, family history with the patient/caregiver today.   ROS  Ten systems reviewed and is negative except as mentioned in HPI    Objective  Vitals:   12/01/23 1528 12/01/23 1541  BP: (!) 144/86 132/84  Pulse: 94   Resp: 16   SpO2: 97%   Weight: 222 lb 6.4 oz (100.9 kg)   Height: 5' 8 (1.727 m)     Body mass index is 33.82 kg/m.  Physical Exam  Constitutional: Patient appears well-developed and well-nourished. Obese  No distress.  HEENT: head atraumatic, normocephalic, pupils equal and reactive to light, neck supple Cardiovascular: Normal rate, regular rhythm and normal heart sounds.  No murmur heard. No BLE edema. Pulmonary/Chest: Effort normal and breath sounds normal. No respiratory distress. Abdominal: Soft.  There is no tenderness. Psychiatric: Patient has a normal mood and affect. behavior is normal. Judgment and thought content normal.   Recent Results (from the past 2160 hours)  Hemoglobin A1c     Status: Abnormal   Collection Time: 10/09/23 11:33 AM  Result Value Ref Range   Hgb A1c MFr Bld 6.1 (H) <5.7 % of total Hgb    Comment: For someone without known diabetes, a hemoglobin  A1c value between 5.7% and 6.4% is consistent with prediabetes and should be confirmed with a  follow-up test. . For someone with known diabetes, a value <7% indicates that their diabetes is well controlled. A1c targets should be individualized based on duration of diabetes, age, comorbid conditions, and other considerations. . This assay result is consistent with an increased risk of diabetes. . Currently, no consensus exists regarding use of hemoglobin A1c for diagnosis of diabetes for children. .    Mean Plasma Glucose 128 mg/dL   eAG (mmol/L) 7.1 mmol/L  Microalbumin / creatinine urine ratio     Status: Abnormal   Collection  Time: 10/09/23 11:33 AM  Result Value Ref Range   Creatinine, Urine 17 (L) 20 - 275 mg/dL   Microalb, Ur 0.2 mg/dL    Comment: Reference Range Not established    Microalb Creat Ratio 12 <30 mg/g creat    Comment: . The ADA defines abnormalities in albumin excretion as follows: SABRA Albuminuria Category        Result (mg/g creatinine) . Normal to Mildly increased   <30 Moderately increased         30-299  Severely increased           >  OR = 300 . The ADA recommends that at least two of three specimens collected within a 3-6 month period be abnormal before considering a patient to be within a diagnostic category.   COMPLETE METABOLIC PANEL WITH GFR     Status: Abnormal   Collection Time: 10/09/23 11:33 AM  Result Value Ref Range   Glucose, Bld 94 65 - 99 mg/dL    Comment: .            Fasting reference interval .    BUN 14 7 - 25 mg/dL   Creat 9.21 9.49 - 8.96 mg/dL   eGFR 87 > OR = 60 fO/fpw/8.26f7   BUN/Creatinine Ratio SEE NOTE: 6 - 22 (calc)    Comment:    Not Reported: BUN and Creatinine are within    reference range. .    Sodium 140 135 - 146 mmol/L   Potassium 4.1 3.5 - 5.3 mmol/L   Chloride 102 98 - 110 mmol/L   CO2 28 20 - 32 mmol/L   Calcium 9.5 8.6 - 10.4 mg/dL   Total Protein 7.1 6.1 - 8.1 g/dL   Albumin 4.6 3.6 - 5.1 g/dL   Globulin 2.5 1.9 - 3.7 g/dL (calc)   AG Ratio 1.8 1.0 - 2.5 (calc)   Total Bilirubin 0.4 0.2 - 1.2 mg/dL   Alkaline phosphatase (APISO) 105 37 - 153 U/L   AST 20 10 - 35 U/L   ALT 32 (H) 6 - 29 U/L  CBC with Differential/Platelet     Status: None   Collection Time: 10/09/23 11:33 AM  Result Value Ref Range   WBC 9.5 3.8 - 10.8 Thousand/uL   RBC 5.05 3.80 - 5.10 Million/uL   Hemoglobin 14.6 11.7 - 15.5 g/dL   HCT 55.9 64.9 - 54.9 %   MCV 87.1 80.0 - 100.0 fL   MCH 28.9 27.0 - 33.0 pg   MCHC 33.2 32.0 - 36.0 g/dL    Comment: For adults, a slight decrease in the calculated MCHC value (in the range of 30 to 32 g/dL) is most  likely not clinically significant; however, it should be interpreted with caution in correlation with other red cell parameters and the patient's clinical condition.    RDW 13.3 11.0 - 15.0 %   Platelets 278 140 - 400 Thousand/uL   MPV 10.8 7.5 - 12.5 fL   Neutro Abs 5,054 1,500 - 7,800 cells/uL   Absolute Lymphocytes 3,582 850 - 3,900 cells/uL   Absolute Monocytes 599 200 - 950 cells/uL   Eosinophils Absolute 190 15 - 500 cells/uL   Basophils Absolute 76 0 - 200 cells/uL   Neutrophils Relative % 53.2 %   Total Lymphocyte 37.7 %   Monocytes Relative 6.3 %   Eosinophils Relative 2.0 %   Basophils Relative 0.8 %  TSH     Status: None   Collection Time: 10/09/23 11:33 AM  Result Value Ref Range   TSH 1.10 0.40 - 4.50 mIU/L    Diabetic Foot Exam:     PHQ2/9:    12/01/2023    3:25 PM 11/03/2023    3:41 PM 10/09/2023   10:54 AM 01/01/2023    2:21 PM 01/01/2022   12:57 PM  Depression screen PHQ 2/9  Decreased Interest 0 0 0 0 1  Down, Depressed, Hopeless 0 0 0 0 0  PHQ - 2 Score 0 0 0 0 1  Altered sleeping 0 0 0 0 0  Tired, decreased energy 0 0 0 0 0  Change in appetite 0 0 0 0 0  Feeling bad or failure about yourself  0 0 0 0 0  Trouble concentrating 0 0 0 0 0  Moving slowly or fidgety/restless 0 0 0 0 0  Suicidal thoughts 0 0 0 0 0  PHQ-9 Score 0 0 0 0 1  Difficult doing work/chores Not difficult at all Not difficult at all Not difficult at all  Not difficult at all    phq 9 is negative   Fall Risk:    12/01/2023    3:22 PM 10/09/2023   10:54 AM 01/01/2023    2:21 PM 01/01/2022   12:56 PM 12/06/2020    3:02 PM  Fall Risk   Falls in the past year? 0 0 0 0   Number falls in past yr: 0 0 0 0 0  Injury with Fall? 0 0 0 0 0  Risk for fall due to : No Fall Risks No Fall Risks No Fall Risks No Fall Risks   Follow up Falls prevention discussed;Education provided;Falls evaluation completed Falls prevention discussed;Education provided;Falls evaluation completed Falls  prevention discussed Falls prevention discussed      Assessment & Plan  Assessment and Plan    Hypertension Blood pressure improved but still elevated (130s-140s) on Olmesartan /HCTZ 20/12.5mg . Patient is making lifestyle changes including weight loss and exercise. -Increase Olmesartan /HCTZ to 40/12.5mg  daily. -Continue lifestyle modifications. -Check blood pressure at next physical.  Anxiety Managed on Buspar  2.5mg  daily. No new concerns. -Continue Buspar  2.5mg  daily.  Right Knee Crepitus No pain, swelling, or instability. Likely osteoarthritis. -Continue with current exercise regimen. -Avoid NSAIDs due to hypertension.  Weight Management Patient is actively working on weight loss through diet and exercise. Lost 7 pounds over the past week. Zepbound  not covered by insurance -Continue current weight loss efforts. -Consider weight loss medication if not successful with current efforts.  General Health Maintenance -Continue low salt diet. -Complete labs at next physical.

## 2023-12-05 ENCOUNTER — Encounter: Payer: Self-pay | Admitting: Family Medicine

## 2024-01-06 ENCOUNTER — Encounter: Payer: Commercial Managed Care - PPO | Admitting: Family Medicine

## 2024-01-09 ENCOUNTER — Other Ambulatory Visit: Payer: Self-pay

## 2024-01-09 ENCOUNTER — Ambulatory Visit (INDEPENDENT_AMBULATORY_CARE_PROVIDER_SITE_OTHER): Payer: Commercial Managed Care - PPO | Admitting: Family Medicine

## 2024-01-09 ENCOUNTER — Encounter: Payer: Self-pay | Admitting: Family Medicine

## 2024-01-09 VITALS — BP 122/72 | HR 82 | Resp 16 | Ht 68.0 in | Wt 220.0 lb

## 2024-01-09 DIAGNOSIS — Z1231 Encounter for screening mammogram for malignant neoplasm of breast: Secondary | ICD-10-CM | POA: Diagnosis not present

## 2024-01-09 DIAGNOSIS — Z0000 Encounter for general adult medical examination without abnormal findings: Secondary | ICD-10-CM

## 2024-01-09 DIAGNOSIS — Z0001 Encounter for general adult medical examination with abnormal findings: Secondary | ICD-10-CM | POA: Diagnosis not present

## 2024-01-09 DIAGNOSIS — E785 Hyperlipidemia, unspecified: Secondary | ICD-10-CM | POA: Diagnosis not present

## 2024-01-09 MED ORDER — OLMESARTAN MEDOXOMIL-HCTZ 20-12.5 MG PO TABS: 1.0000 | ORAL_TABLET | Freq: Every day | ORAL | 0 refills | Status: DC

## 2024-01-09 NOTE — Progress Notes (Signed)
Name: Jennifer Oconnell   MRN: [id number]    DOB: 03-Nov-1961   Date:01/09/2024       Progress Note  Subjective  Chief Complaint  Chief Complaint  Patient presents with   Annual Exam    HPI  Patient presents for annual CPE.  Diet: avoiding salt, cutting down on carbohydrates Exercise: walking daily, counting her steps  Last Eye Exam: declined Last Dental Exam: not applicable she wears dentures  Flowsheet Row Office Visit from 11/03/2023 in [facility]  AUDIT-C Score 4       Depression: Phq 9 is  negative    12/01/2023    3:25 PM 11/03/2023    3:41 PM 10/09/2023   10:54 AM 01/01/2023    2:21 PM 01/01/2022   12:57 PM  Depression screen PHQ 2/9  Decreased Interest 0 0 0 0 1  Down, Depressed, Hopeless 0 0 0 0 0  PHQ - 2 Score 0 0 0 0 1  Altered sleeping 0 0 0 0 0  Tired, decreased energy 0 0 0 0 0  Change in appetite 0 0 0 0 0  Feeling bad or failure about yourself  0 0 0 0 0  Trouble concentrating 0 0 0 0 0  Moving slowly or fidgety/restless 0 0 0 0 0  Suicidal thoughts 0 0 0 0 0  PHQ-9 Score 0 0 0 0 1  Difficult doing work/chores Not difficult at all Not difficult at all Not difficult at all  Not difficult at all   Hypertension: BP Readings from Last 3 Encounters:  01/09/24 122/72  12/01/23 132/84  11/03/23 (!) 158/82   Obesity: Wt Readings from Last 3 Encounters:  01/09/24 220 lb (99.8 kg)  12/01/23 222 lb 6.4 oz (100.9 kg)  11/03/23 221 lb 11.2 oz (100.6 kg)   BMI Readings from Last 3 Encounters:  01/09/24 33.45 kg/m  12/01/23 33.82 kg/m  11/03/23 33.71 kg/m     Vaccines: reviewed with the patient.   Hep C Screening: completed STD testing and prevention (HIV/chl/gon/syphilis): N/A Intimate partner violence: negative screen  Sexual History : no problems  Menstrual History/LMP/Abnormal Bleeding: post menopausal  Discussed importance of follow up if any post-menopausal bleeding: yes  Incontinence Symptoms: negative for  symptoms   Breast cancer:  - Last Mammogram: 12/2022  - BRCA gene screening: only sibling with pancreatic cancer and one with skin cancer  Osteoporosis Prevention : Discussed high calcium and vitamin D supplementation, weight bearing exercises Bone density :she will wait until at 63 yo   Cervical cancer screening: up-to-date  Skin cancer: Discussed monitoring for atypical lesions  Colorectal cancer:   poor prep Nov 2024 and advised to repeat it in 2025 Lung cancer:  Low Dose CT Chest recommended if Age 39-80 years, 20 pack-year currently smoking OR have quit w/in 15years. Patient does not qualify for screen   ECG: 2024  Advanced Care Planning: A voluntary discussion about advance care planning including the explanation and discussion of advance directives.  Discussed health care proxy and Living will, and the patient was able to identify a health care proxy as husband.  Patient does not have a living will and power of attorney of health care   Patient Active Problem List   Diagnosis Date Noted   Screening for colon cancer 09/26/2023   Aortic atherosclerosis (HCC) 12/26/2020   Dysthymia 09/15/2019   Overweight (BMI 25.0-29.9) 07/29/2018   Anxiety 07/29/2018   History of abnormal cervical Pap smear 03/23/2018  Goiter 08/13/2016   History of ulcerative colitis 08/13/2016   Tobacco use 08/13/2016   Dyslipidemia 08/13/2016   Personal history of other malignant neoplasm of skin 01/03/2014    Past Surgical History:  Procedure Laterality Date   COLONOSCOPY WITH PROPOFOL N/A 09/26/2023   Procedure: COLONOSCOPY WITH PROPOFOL;  Surgeon: Toney Reil, MD;  Location: [facility] ENDOSCOPY;  Service: Gastroenterology;  Laterality: N/A;   COLPOSCOPY W/ BIOPSY / CURETTAGE     [facility] biopsy   04/2018   clear margins    MOHS SURGERY  2014   Face    Family History  Problem Relation Age of Onset   Cancer Mother        colon   Cancer Father        Lung   Cancer Sister        pancreatic    Cancer Sister        skin   Cancer Brother        Lung    Social History   Socioeconomic History   Marital status: Married    Spouse name: Verlon Au   Number of children: 17   Years of education: Not on file   Highest education level: Associate degree: academic program  Occupational History   Occupation: Interior and spatial designer of nursing     Employer: [employer]  Tobacco Use   Smoking status: Former    Current packs/day: 0.00    Average packs/day: 0.2 packs/day for 46.3 years (6.9 ttl pk-yrs)    Types: Cigarettes    Start date: 08/13/1976    Quit date: 11/17/2022    Years since quitting: 1.1   Smokeless tobacco: Never  Vaping Use   Vaping status: Never Used  Substance and Sexual Activity   Alcohol use: Yes    Alcohol/week: 1.0 standard drink of alcohol    Types: 1 Glasses of wine per week   Drug use: No   Sexual activity: Yes    Partners: Male    Birth control/protection: None  Other Topics Concern   Not on file  Social History Narrative   Married, 8 biological children, 2 step-children and 7 adopted children.    She has 19 grandchildren   Working at [employer] - she is working at [employer] care    Social Drivers of [employer]: Low Risk  (01/09/2024)   Overall Financial Resource Strain (CARDIA)    Difficulty of Paying Living Expenses: Not hard at all  Food Insecurity: No Food Insecurity (01/09/2024)   Hunger Vital Sign    Worried About Running Out of Food in the Last Year: Never true    Ran Out of Food in the Last Year: Never true  Transportation Needs: No Transportation Needs (01/09/2024)   PRAPARE - Administrator, Civil Service (Medical): No    Lack of Transportation (Non-Medical): No  Physical Activity: Insufficiently Active (01/09/2024)   Exercise Vital Sign    Days of Exercise per Week: 7 days    Minutes of Exercise per Session: 20 min  Stress: No Stress Concern Present (01/09/2024)   [employer] of Occupational Health -  Occupational Stress Questionnaire    Feeling of Stress : Only a little  Recent Concern: Stress - Stress Concern Present (11/03/2023)   [employer] of Occupational Health - Occupational Stress Questionnaire    Feeling of Stress : To some extent  Social Connections: Moderately Integrated (01/09/2024)   Social Connection and Isolation Panel [NHANES]  Frequency of Communication with Friends and Family: More than three times a week    Frequency of Social Gatherings with Friends and Family: Once a week    Attends Religious Services: More than 4 times per year    Active Member of [employer] or Organizations: No    Attends Banker Meetings: Never    Marital Status: Married  Catering manager Violence: Not At Risk (01/09/2024)   Humiliation, Afraid, Rape, and Kick questionnaire    Fear of Current or Ex-Partner: No    Emotionally Abused: No    Physically Abused: No    Sexually Abused: No     Current Outpatient Medications:    Ascorbic Acid (VITAMIN C) 1000 MG tablet, Take 4,000 mg by mouth daily., Disp: , Rfl:    busPIRone (BUSPAR) 5 MG tablet, Take 1-2 tablets (5-10 mg total) by mouth 3 (three) times daily as needed., Disp: 90 tablet, Rfl: 0   traZODone (DESYREL) 50 MG tablet, TAKE 1 TABLET BY MOUTH AT BEDTIME, Disp: 90 tablet, Rfl: 0  No Known Allergies   ROS  Constitutional: Negative for fever or singificant weight change.  Respiratory: Negative for cough and shortness of breath.   Cardiovascular: Negative for chest pain or palpitations.  Gastrointestinal: Negative for abdominal pain, no bowel changes.  Musculoskeletal: Negative for gait problem or joint swelling.  Skin: Negative for rash.  Neurological: Negative for dizziness or headache.  No other specific complaints in a complete review of systems (except as listed in HPI above).   Objective  Vitals:   01/09/24 1514  BP: 122/72  Pulse: 82  Resp: 16  SpO2: 99%  Weight: 220 lb (99.8 kg)  Height: 5\' 8"  (1.727  m)    Body mass index is 33.45 kg/m.  Physical Exam  Constitutional: Patient appears well-developed and well-nourished. No distress.  HENT: Head: Normocephalic and atraumatic. Ears: B TMs ok, no erythema or effusion; Nose: Nose normal. Mouth/Throat: Oropharynx is clear and moist. No oropharyngeal exudate.  Eyes: Conjunctivae and EOM are normal. Pupils are equal, round, and reactive to light. No scleral icterus.  Neck: Normal range of motion. Neck supple. No JVD present. No thyromegaly present.  Cardiovascular: Normal rate, regular rhythm and normal heart sounds.  No murmur heard. No BLE edema. Pulmonary/Chest: Effort normal and breath sounds normal. No respiratory distress. Abdominal: Soft. Bowel sounds are normal, no distension. There is no tenderness. no masses Breast: no lumps or masses, no nipple discharge or rashes FEMALE GENITALIA:  Not done  RECTAL: not done  Musculoskeletal: Normal range of motion, no joint effusions. No gross deformities Neurological: he is alert and oriented to person, place, and time. No cranial nerve deficit. Coordination, balance, strength, speech and gait are normal.  Skin: Skin is warm and dry. No rash noted. No erythema.  Psychiatric: Patient has a normal mood and affect. behavior is normal. Judgment and thought content normal.     Assessment & Plan  1. Well adult exam (Primary)  - COMPLETE METABOLIC PANEL WITH GFR - Lipid panel  She has been compliant with Benicar hydrochlorothiazide 40/12.5 mg but only taking half a pill since bp at home has been consistently below 120/80   2. Breast cancer screening by mammogram  - MM 3D SCREENING MAMMOGRAM BILATERAL BREAST; Future  3. Dyslipidemia  - Lipid panel   -USPSTF grade A and B recommendations reviewed with patient; age-appropriate recommendations, preventive care, screening tests, etc discussed and encouraged; healthy living encouraged; see AVS for patient education given to  patient -Discussed importance of 150 minutes of physical activity weekly, eat two servings of fish weekly, eat one serving of tree nuts ( cashews, pistachios, pecans, almonds.Marland Kitchen) every other day, eat 6 servings of fruit/vegetables daily and drink plenty of water and avoid sweet beverages.   -Reviewed Health Maintenance: Yes.

## 2024-01-10 LAB — COMPLETE METABOLIC PANEL WITH GFR
AG Ratio: 1.7 (calc) (ref 1.0–2.5)
ALT: 34 U/L — ABNORMAL HIGH (ref 6–29)
AST: 24 U/L (ref 10–35)
Albumin: 4.7 g/dL (ref 3.6–5.1)
Alkaline phosphatase (APISO): 106 U/L (ref 37–153)
BUN/Creatinine Ratio: 15 (calc) (ref 6–22)
BUN: 16 mg/dL (ref 7–25)
CO2: 28 mmol/L (ref 20–32)
Calcium: 9.8 mg/dL (ref 8.6–10.4)
Chloride: 101 mmol/L (ref 98–110)
Creat: 1.06 mg/dL — ABNORMAL HIGH (ref 0.50–1.05)
Globulin: 2.7 g/dL (ref 1.9–3.7)
Glucose, Bld: 94 mg/dL (ref 65–99)
Potassium: 4.1 mmol/L (ref 3.5–5.3)
Sodium: 139 mmol/L (ref 135–146)
Total Bilirubin: 0.5 mg/dL (ref 0.2–1.2)
Total Protein: 7.4 g/dL (ref 6.1–8.1)
eGFR: 60 mL/min/{1.73_m2} (ref >=60)

## 2024-01-10 LAB — LIPID PANEL
Cholesterol: 274 mg/dL — ABNORMAL HIGH (ref <200)
HDL: 52 mg/dL (ref >=50)
LDL Cholesterol (Calc): 170 mg/dL — ABNORMAL HIGH
Non-HDL Cholesterol (Calc): 222 mg/dL — ABNORMAL HIGH (ref <130)
Total CHOL/HDL Ratio: 5.3 (calc) — ABNORMAL HIGH (ref <5.0)
Triglycerides: 304 mg/dL — ABNORMAL HIGH (ref <150)

## 2024-01-12 ENCOUNTER — Encounter: Payer: Self-pay | Admitting: Family Medicine

## 2024-01-20 ENCOUNTER — Other Ambulatory Visit: Payer: Self-pay | Admitting: Family Medicine

## 2024-01-20 DIAGNOSIS — F5104 Psychophysiologic insomnia: Secondary | ICD-10-CM

## 2024-01-21 ENCOUNTER — Other Ambulatory Visit: Payer: Self-pay | Admitting: Family Medicine

## 2024-01-21 DIAGNOSIS — F5104 Psychophysiologic insomnia: Secondary | ICD-10-CM

## 2024-01-26 ENCOUNTER — Other Ambulatory Visit: Payer: Self-pay | Admitting: Family Medicine

## 2024-01-26 DIAGNOSIS — F419 Anxiety disorder, unspecified: Secondary | ICD-10-CM

## 2024-01-26 MED ORDER — BUSPIRONE HCL 5 MG PO TABS: 5.0000 mg | ORAL_TABLET | Freq: Three times a day (TID) | ORAL | 0 refills | Status: DC | PRN

## 2024-03-09 ENCOUNTER — Ambulatory Visit (INDEPENDENT_AMBULATORY_CARE_PROVIDER_SITE_OTHER): Payer: Self-pay | Admitting: Family Medicine

## 2024-03-09 ENCOUNTER — Encounter: Payer: Self-pay | Admitting: Family Medicine

## 2024-03-09 VITALS — BP 132/74 | HR 86 | Temp 97.4°F | Resp 16 | Ht 68.0 in | Wt 218.7 lb

## 2024-03-09 DIAGNOSIS — E785 Hyperlipidemia, unspecified: Secondary | ICD-10-CM | POA: Diagnosis not present

## 2024-03-09 DIAGNOSIS — I10 Essential (primary) hypertension: Secondary | ICD-10-CM | POA: Diagnosis not present

## 2024-03-09 DIAGNOSIS — E049 Nontoxic goiter, unspecified: Secondary | ICD-10-CM

## 2024-03-09 DIAGNOSIS — E66811 Obesity, class 1: Secondary | ICD-10-CM | POA: Diagnosis not present

## 2024-03-09 DIAGNOSIS — F5104 Psychophysiologic insomnia: Secondary | ICD-10-CM

## 2024-03-09 DIAGNOSIS — I700 Atherosclerosis of aorta: Secondary | ICD-10-CM | POA: Diagnosis not present

## 2024-03-09 DIAGNOSIS — F419 Anxiety disorder, unspecified: Secondary | ICD-10-CM

## 2024-03-09 MED ORDER — TRAZODONE HCL 50 MG PO TABS
50.0000 mg | ORAL_TABLET | Freq: Every day | ORAL | 1 refills | Status: DC
Start: 2024-03-09 — End: 2024-09-08

## 2024-03-09 MED ORDER — BUSPIRONE HCL 5 MG PO TABS: 5.0000 mg | ORAL_TABLET | Freq: Two times a day (BID) | ORAL | 1 refills | Status: DC | PRN

## 2024-03-09 MED ORDER — OLMESARTAN MEDOXOMIL-HCTZ 20-12.5 MG PO TABS: 1.0000 | ORAL_TABLET | Freq: Every day | ORAL | 1 refills | Status: DC

## 2024-03-09 NOTE — Progress Notes (Signed)
 Name: Jennifer Oconnell   MRN: [id number]    DOB: 1961/02/05   Date:03/09/2024       Progress Note  Subjective  Chief Complaint  Chief Complaint  Patient presents with   Medical Management of Chronic Issues   Discussed the use of AI scribe software for clinical note transcription with the patient, who gave verbal consent to proceed.  History of Present Illness Jennifer Oconnell "Leland Purser" is a 63 year old female who presents for a follow-up visit.  She attends the gym three times a week for an hour each session, typically after work. She feels tired upon arrival but better after completing her workout. She missed her usual routine due to a recent cruise but plans to make up for it by attending additional sessions this week. Her weight has decreased from 222 pounds in January to 218 pounds, despite gaining two pounds during the cruise. She notes that she can fit into clothes that were previously tight, indicating a positive change in her body composition. She is still in the obesity range but doing well with life style modifications   Her blood pressure is typically around 113/70 mmHg at home. She is currently taking olmesartan  HCTZ 20/12.5 mg daily without any side effects such as headaches, chest pain, palpitations, or swelling. She is trying to reduce salt intake in her diet.  She has dyslipidemia with previous lab results showing a cholesterol level of 170 mg/dL and triglycerides at 829 mg/dL. She plans to improve her diet by reducing carbohydrate intake. Even though she is aware of diagnosis of aorta atherosclerosis she is not ready to start statin therapy   She has pre diabetes/metabolic syndrome but denies polyphagia, polydipsia or polyuria  and she is working on dietary changes  She takes trazodone  for sleep, which helps her fall and stay asleep without side effects. She takes it almost every night. She is also managing mild depression and anxiety with Buspar , taking 5-10 mg every morning  and occasionally in the evening if needed. She feels she is coping better with stress.  She takes aspirin daily and a high dose of vitamin C. She had a colonoscopy in November 2024, which was not fully successful due to incomplete bowel preparation.    Patient Active Problem List   Diagnosis Date Noted   Screening for colon cancer 09/26/2023   Aortic atherosclerosis (HCC) 12/26/2020   Dysthymia 09/15/2019   Overweight (BMI 25.0-29.9) 07/29/2018   Anxiety 07/29/2018   History of abnormal cervical Pap smear 03/23/2018   Goiter 08/13/2016   History of ulcerative colitis 08/13/2016   Tobacco use 08/13/2016   Dyslipidemia 08/13/2016   Personal history of other malignant neoplasm of skin 01/03/2014    Past Surgical History:  Procedure Laterality Date   COLONOSCOPY WITH PROPOFOL  N/A 09/26/2023   Procedure: COLONOSCOPY WITH PROPOFOL ;  Surgeon: Selena Daily, MD;  Location: [facility] ENDOSCOPY;  Service: Gastroenterology;  Laterality: N/A;   COLPOSCOPY W/ BIOPSY / CURETTAGE     [facility] biopsy   04/2018   clear margins    MOHS SURGERY  2014   Face    Family History  Problem Relation Age of Onset   Cancer Mother        colon   Cancer Father        Lung   Cancer Sister        pancreatic   Cancer Sister        skin   Cancer Brother  Lung    Social History   Tobacco Use   Smoking status: Former    Current packs/day: 0.15    Average packs/day: 0.2 packs/day for 47.6 years (7.1 ttl pk-yrs)    Types: Cigarettes    Start date: 08/13/1976   Smokeless tobacco: Never   Tobacco comments:    Never smoked a lot, she quit for a long time, currently smoking 2-3  per day  Substance Use Topics   Alcohol use: Yes    Alcohol/week: 1.0 standard drink of alcohol    Types: 1 Glasses of wine per week     Current Outpatient Medications:    Ascorbic Acid (VITAMIN C) 1000 MG tablet, Take 4,000 mg by mouth daily., Disp: , Rfl:    busPIRone  (BUSPAR ) 5 MG tablet, Take 1-2 tablets (5-10 mg  total) by mouth 3 (three) times daily as needed., Disp: 90 tablet, Rfl: 0   olmesartan -hydrochlorothiazide  (BENICAR  HCT) 20-12.5 MG tablet, Take 1 tablet by mouth daily., Disp: 90 tablet, Rfl: 0   traZODone  (DESYREL ) 50 MG tablet, TAKE 1 TABLET BY MOUTH AT BEDTIME, Disp: 90 tablet, Rfl: 0  No Known Allergies  I personally reviewed active problem list, medication list, allergies, family history with the patient/caregiver today.   ROS  Ten systems reviewed and is negative except as mentioned in HPI    Objective Physical Exam Constitutional: Patient appears well-developed and well-nourished. Obese  No distress.  HEENT: head atraumatic, normocephalic, pupils equal and reactive to light, neck supple Cardiovascular: Normal rate, regular rhythm and normal heart sounds.  No murmur heard. No BLE edema. Pulmonary/Chest: Effort normal and breath sounds normal. No respiratory distress. Abdominal: Soft.  There is no tenderness. Psychiatric: Patient has a normal mood and affect. behavior is normal. Judgment and thought content normal.    Vitals:   03/09/24 1311  BP: 132/74  Pulse: 86  Resp: 16  Temp: (!) 97.4 F (36.3 C)  TempSrc: Oral  SpO2: 96%  Weight: 218 lb 11.2 oz (99.2 kg)  Height: 5\' 8"  (1.727 m)    Body mass index is 33.25 kg/m.  Recent Results (from the past 2160 hours)  COMPLETE METABOLIC PANEL WITH GFR     Status: Abnormal   Collection Time: 01/09/24  3:51 PM  Result Value Ref Range   Glucose, Bld 94 65 - 99 mg/dL    Comment: .            Fasting reference interval .    BUN 16 7 - 25 mg/dL   Creat 1.19 (H) 1.47 - 1.05 mg/dL   eGFR 60 > OR = 60 WG/NFA/2.13Y8   BUN/Creatinine Ratio 15 6 - 22 (calc)   Sodium 139 135 - 146 mmol/L   Potassium 4.1 3.5 - 5.3 mmol/L   Chloride 101 98 - 110 mmol/L   CO2 28 20 - 32 mmol/L   Calcium 9.8 8.6 - 10.4 mg/dL   Total Protein 7.4 6.1 - 8.1 g/dL   Albumin 4.7 3.6 - 5.1 g/dL   Globulin 2.7 1.9 - 3.7 g/dL (calc)   AG Ratio 1.7  1.0 - 2.5 (calc)   Total Bilirubin 0.5 0.2 - 1.2 mg/dL   Alkaline phosphatase (APISO) 106 37 - 153 U/L   AST 24 10 - 35 U/L   ALT 34 (H) 6 - 29 U/L  Lipid panel     Status: Abnormal   Collection Time: 01/09/24  3:51 PM  Result Value Ref Range   Cholesterol 274 (H) <200 mg/dL  HDL 52 > OR = 50 mg/dL   Triglycerides 161 (H) <150 mg/dL    Comment: . If a non-fasting specimen was collected, consider repeat triglyceride testing on a fasting specimen if clinically indicated.  Imagene Mam et al. J. of Clin. Lipidol. 2015;9:129-169. Aaron Aas    LDL Cholesterol (Calc) 170 (H) mg/dL (calc)    Comment: Reference range: <100 . Desirable range <100 mg/dL for primary prevention;   <70 mg/dL for patients with CHD or diabetic patients  with > or = 2 CHD risk factors. Aaron Aas LDL-C is now calculated using the Martin-Hopkins  calculation, which is a validated novel method providing  better accuracy than the Friedewald equation in the  estimation of LDL-C.  Melinda Sprawls et al. Erroll Heard. 0960;454(09): 2061-2068  (http://education.[facility].com/faq/FAQ164)    Total CHOL/HDL Ratio 5.3 (H) <5.0 (calc)   Non-HDL Cholesterol (Calc) 222 (H) <130 mg/dL (calc)    Comment: Non-HDL level > or = 220 is very high and may indicate  genetic familial hypercholesterolemia (FH). Clinical  assessment and measurement of blood lipid levels  should be considered for all first-degree relatives  of patients with an FH diagnosis. . For patients with diabetes plus 1 major ASCVD risk  factor, treating to a non-HDL-C goal of <100 mg/dL  (LDL-C of <81 mg/dL) is considered a therapeutic  option.     Diabetic Foot Exam:     PHQ2/9:    03/09/2024    1:07 PM 12/01/2023    3:25 PM 11/03/2023    3:41 PM 10/09/2023   10:54 AM 01/01/2023    2:21 PM  Depression screen PHQ 2/9  Decreased Interest 0 0 0 0 0  Down, Depressed, Hopeless 0 0 0 0 0  PHQ - 2 Score 0 0 0 0 0  Altered sleeping 0 0 0 0 0  Tired, decreased energy 0 0 0 0  0  Change in appetite 0 0 0 0 0  Feeling bad or failure about yourself  0 0 0 0 0  Trouble concentrating 0 0 0 0 0  Moving slowly or fidgety/restless 0 0 0 0 0  Suicidal thoughts 0 0 0 0 0  PHQ-9 Score 0 0 0 0 0  Difficult doing work/chores Not difficult at all Not difficult at all Not difficult at all Not difficult at all     phq 9 is negative  Fall Risk:    12/01/2023    3:22 PM 10/09/2023   10:54 AM 01/01/2023    2:21 PM 01/01/2022   12:56 PM 12/06/2020    3:02 PM  Fall Risk   Falls in the past year? 0 0 0 0   Number falls in past yr: 0 0 0 0 0  Injury with Fall? 0 0 0 0 0  Risk for fall due to : No Fall Risks No Fall Risks No Fall Risks No Fall Risks   Follow up Falls prevention discussed;Education provided;Falls evaluation completed Falls prevention discussed;Education provided;Falls evaluation completed Falls prevention discussed Falls prevention discussed      Assessment and Plan Assessment & Plan  Health maintenance :  - Continue exercise regimen of 180 minutes per week. - Maintain diet with reduced salt intake. - Follow up with gastroenterology for repeat colonoscopy with improved bowel preparation, including a low-residue diet two weeks prior and three preps.  Hypertension Hypertension well-controlled with olmesartan  HCTZ 20/12.5 mg. No side effects reported. - Continue olmesartan  HCTZ 20/12.5 mg daily. - Encourage home blood pressure monitoring. - Reinforce dietary modifications to  reduce salt intake.  Dyslipidemia Dyslipidemia with elevated LDL and triglycerides. Prefers lifestyle modifications over medication. - Order fasting lipid panel prior to next visit. - Encourage dietary modifications to reduce carbohydrate intake and increase physical activity.  Aortic atherosclerosis Aortic atherosclerosis noted. Prefers lifestyle modifications over medication. - Continue lifestyle modifications including diet and exercise. - Discuss potential for cholesterol  medication if lifestyle changes are insufficient.  Obesity Obesity with BMI of 33.25. Weight loss noted since last visit. - Continue current exercise regimen and dietary modifications. - Monitor weight and BMI at follow-up visits.  Metabolic dysfunction Metabolic dysfunction with slightly elevated liver enzymes and A1c. No diabetes symptoms reported. - Encourage reduction in carbohydrate intake and increase in physical activity. - Monitor liver enzymes and A1c at next lab work.  Mild depression and Anxiety Mild depression and anxiety managed with Buspar . Coping well with stress. - Continue Buspar  5-10 mg daily, with option for additional dose in the evening as needed. - Provide prescription for 120 tablets with refills.

## 2024-09-06 ENCOUNTER — Other Ambulatory Visit: Payer: Self-pay | Admitting: Medical Genetics

## 2024-09-08 ENCOUNTER — Ambulatory Visit (INDEPENDENT_AMBULATORY_CARE_PROVIDER_SITE_OTHER): Admitting: Family Medicine

## 2024-09-08 ENCOUNTER — Encounter: Payer: Self-pay | Admitting: Family Medicine

## 2024-09-08 ENCOUNTER — Other Ambulatory Visit
Admission: RE | Admit: 2024-09-08 | Discharge: 2024-09-08 | Disposition: A | Discharge status: Home / Self Care | Payer: Self-pay | Source: Ambulatory Visit | Attending: Medical Genetics | Admitting: Medical Genetics

## 2024-09-08 VITALS — BP 128/76 | HR 82 | Resp 16 | Ht 68.0 in | Wt 218.2 lb

## 2024-09-08 DIAGNOSIS — E785 Hyperlipidemia, unspecified: Secondary | ICD-10-CM

## 2024-09-08 DIAGNOSIS — I10 Essential (primary) hypertension: Secondary | ICD-10-CM | POA: Insufficient documentation

## 2024-09-08 DIAGNOSIS — E049 Nontoxic goiter, unspecified: Secondary | ICD-10-CM | POA: Diagnosis not present

## 2024-09-08 DIAGNOSIS — I700 Atherosclerosis of aorta: Secondary | ICD-10-CM

## 2024-09-08 DIAGNOSIS — F5104 Psychophysiologic insomnia: Secondary | ICD-10-CM

## 2024-09-08 DIAGNOSIS — Z1211 Encounter for screening for malignant neoplasm of colon: Secondary | ICD-10-CM

## 2024-09-08 DIAGNOSIS — F338 Other recurrent depressive disorders: Secondary | ICD-10-CM | POA: Insufficient documentation

## 2024-09-08 DIAGNOSIS — F419 Anxiety disorder, unspecified: Secondary | ICD-10-CM

## 2024-09-08 DIAGNOSIS — R739 Hyperglycemia, unspecified: Secondary | ICD-10-CM | POA: Insufficient documentation

## 2024-09-08 DIAGNOSIS — E66811 Obesity, class 1: Secondary | ICD-10-CM | POA: Insufficient documentation

## 2024-09-08 MED ORDER — TRAZODONE HCL 50 MG PO TABS: 50.0000 mg | ORAL_TABLET | Freq: Every day | ORAL | 1 refills | Status: AC

## 2024-09-08 MED ORDER — OLMESARTAN MEDOXOMIL-HCTZ 20-12.5 MG PO TABS
1.0000 | ORAL_TABLET | Freq: Every day | ORAL | 1 refills | Status: AC
Start: 2024-09-08 — End: ?

## 2024-09-08 MED ORDER — BUPROPION HCL ER (XL) 150 MG PO TB24: 150.0000 mg | ORAL_TABLET | Freq: Every day | ORAL | 1 refills | Status: AC

## 2024-09-08 NOTE — Progress Notes (Signed)
 Name: Jennifer Oconnell   MRN: [id number]    DOB: Mar 23, 1961   Date:09/08/2024       Progress Note  Subjective  Chief Complaint  Chief Complaint  Patient presents with   Medical Management of Chronic Issues   Discussed the use of AI scribe software for clinical note transcription with the patient, who gave verbal consent to proceed.  History of Present Illness Jennifer Oconnell is a 63 year old female who presents for routine follow-up.  She takes Benicar  HCT 20/12.5 mg daily for hypertension. No chest pain, palpitations, or swelling.  Regarding dyslipidemia, her recent labs showed an LDL level of 170 mg/dL. She is managing her cholesterol levels by avoiding fried foods and consuming baked fish, tree nuts, fruits, and vegetables. She is not currently taking medication for dyslipidemia.  Her BMI is over 30, and she is practicing intermittent fasting and walking. Her daughters are taking weight loss medications, but she prefers to manage her weight without medication.  She has a history of a chronic goiter without difficulty swallowing.  There is a family history of colon cancer. She had a colonoscopy earlier this year, which was incomplete due to inadequate bowel preparation, and she plans to reschedule the procedure for January.  She mentions family issues in [place] .  She is overdue for a mammogram, last done in February 2024. She does not wish to undergo a bone density test at this time.    Patient Active Problem List   Diagnosis Date Noted   Aortic atherosclerosis 12/26/2020   Dysthymia 09/15/2019   Anxiety 07/29/2018   History of abnormal cervical Pap smear 03/23/2018   Goiter 08/13/2016   History of ulcerative colitis 08/13/2016   Tobacco use 08/13/2016   Dyslipidemia 08/13/2016   Personal history of other malignant neoplasm of skin 01/03/2014    Past Surgical History:  Procedure Laterality Date   COLONOSCOPY WITH PROPOFOL  N/A 09/26/2023   Procedure:  COLONOSCOPY WITH PROPOFOL ;  Surgeon: Unk Corinn Skiff, MD;  Location: [facility] ENDOSCOPY;  Service: Gastroenterology;  Laterality: N/A;   COLPOSCOPY W/ BIOPSY / CURETTAGE     [facility] biopsy   04/2018   clear margins    MOHS SURGERY  2014   Face    Family History  Problem Relation Age of Onset   Cancer Mother        colon   Cancer Father        Lung   Cancer Sister        pancreatic   Cancer Sister        skin   Cancer Brother        Lung    Social History   Tobacco Use   Smoking status: Former    Current packs/day: 0.15    Average packs/day: 0.2 packs/day for 48.1 years (7.2 ttl pk-yrs)    Types: Cigarettes    Start date: 08/13/1976   Smokeless tobacco: Never   Tobacco comments:    Never smoked a lot, she quit for a long time, currently smoking 2-3  per day  Substance Use Topics   Alcohol use: Yes    Alcohol/week: 1.0 standard drink of alcohol    Types: 1 Glasses of wine per week     Current Outpatient Medications:    Ascorbic Acid (VITAMIN C) 1000 MG tablet, Take 4,000 mg by mouth daily., Disp: , Rfl:    busPIRone  (BUSPAR ) 5 MG tablet, Take 1-2 tablets (5-10 mg total) by mouth 2 (two) times daily  as needed., Disp: 120 tablet, Rfl: 1   Magnesium 200 MG TABS, , Disp: , Rfl:    olmesartan -hydrochlorothiazide  (BENICAR  HCT) 20-12.5 MG tablet, Take 1 tablet by mouth daily., Disp: 90 tablet, Rfl: 1   OVER THE COUNTER MEDICATION, SENNA S ONE A DAY, Disp: , Rfl:    traZODone  (DESYREL ) 50 MG tablet, Take 1 tablet (50 mg total) by mouth at bedtime., Disp: 90 tablet, Rfl: 1   TURMERIC-GINGER-BLACK PEPPER, , Disp: , Rfl:    aspirin EC 81 MG tablet, Take 81 mg by mouth daily. Swallow whole., Disp: , Rfl:   No Known Allergies  I personally reviewed active problem list, medication list, allergies, family history with the patient/caregiver today.   ROS  Ten systems reviewed and is negative except as mentioned in HPI    Objective Physical Exam CONSTITUTIONAL: Patient appears  well-developed and well-nourished. No distress. Normal exam findings. HEENT: Head atraumatic, normocephalic, neck supple. CARDIOVASCULAR: Normal rate, regular rhythm and normal heart sounds. No murmur heard. No BLE edema. PULMONARY: Effort normal and breath sounds normal. No respiratory distress. ABDOMINAL: There is no tenderness or distention. MUSCULOSKELETAL: Normal gait. Without gross motor or sensory deficit. PSYCHIATRIC: Patient has a normal mood and affect. Behavior is normal. Judgment and thought content normal.  Vitals:   09/08/24 1305  BP: 128/76  Pulse: 82  Resp: 16  SpO2: 99%  Weight: 218 lb 3.2 oz (99 kg)  Height: 5' 8 (1.727 m)    Body mass index is 33.18 kg/m.   PHQ2/9:    09/08/2024    1:00 PM 03/09/2024    1:07 PM 12/01/2023    3:25 PM 11/03/2023    3:41 PM 10/09/2023   10:54 AM  Depression screen PHQ 2/9  Decreased Interest 0 0 0 0 0  Down, Depressed, Hopeless 0 0 0 0 0  PHQ - 2 Score 0 0 0 0 0  Altered sleeping  0 0 0 0  Tired, decreased energy  0 0 0 0  Change in appetite  0 0 0 0  Feeling bad or failure about yourself   0 0 0 0  Trouble concentrating  0 0 0 0  Moving slowly or fidgety/restless  0 0 0 0  Suicidal thoughts  0 0 0 0  PHQ-9 Score  0 0 0 0  Difficult doing work/chores  Not difficult at all Not difficult at all Not difficult at all Not difficult at all    phq 9 is negative  Fall Risk:    09/08/2024    1:00 PM 12/01/2023    3:22 PM 10/09/2023   10:54 AM 01/01/2023    2:21 PM 01/01/2022   12:56 PM  Fall Risk   Falls in the past year? 0 0 0 0 0  Number falls in past yr: 0 0 0 0 0  Injury with Fall? 0 0 0 0 0  Risk for fall due to : No Fall Risks No Fall Risks No Fall Risks No Fall Risks No Fall Risks  Follow up Falls evaluation completed Falls prevention discussed;Education provided;Falls evaluation completed Falls prevention discussed;Education provided;Falls evaluation completed Falls prevention discussed Falls prevention discussed       Data saved with a previous flowsheet row definition    Assessment & Plan Hypertension Hypertension is well controlled with current medication regimen. - Continue Benicar  HCTZ 20/12.5 mg.  Dyslipidemia LDL cholesterol elevated at 170 mg/dL. She prefers to avoid medication currently. - Order NMR lipid panel. - Consider medication based  on lipid panel results.  Obesity BMI over 30 with comorbid hypertension. Limited physical activity. Prefers lifestyle modifications over medication. - Download and use MyFitnessPal for dietary tracking. - Walk 9,000 steps daily. - Check insurance coverage for weight loss programs. - Consider weight loss medication if lifestyle changes are insufficient.  Goiter Chronic goiter with no swallowing difficulties. Thyroid  function needs assessment. - Order TSH and free T4 tests.  Colorectal cancer screening surveillance Family history of colorectal cancer. Previous colonoscopy incomplete due to inadequate bowel preparation. - Schedule colonoscopy for January.  General Health Maintenance Mammogram is overdue. Bone density test declined. - Schedule mammogram.

## 2024-09-17 LAB — GENECONNECT MOLECULAR SCREEN: Genetic Analysis Overall Interpretation: NEGATIVE

## 2024-11-14 ENCOUNTER — Encounter: Payer: Self-pay | Admitting: Family Medicine

## 2024-11-14 ENCOUNTER — Telehealth: Admitting: Family

## 2024-11-14 DIAGNOSIS — Z20828 Contact with and (suspected) exposure to other viral communicable diseases: Secondary | ICD-10-CM | POA: Diagnosis not present

## 2024-11-14 DIAGNOSIS — R6889 Other general symptoms and signs: Secondary | ICD-10-CM | POA: Diagnosis not present

## 2024-11-14 DIAGNOSIS — R110 Nausea: Secondary | ICD-10-CM | POA: Diagnosis not present

## 2024-11-14 DIAGNOSIS — M791 Myalgia, unspecified site: Secondary | ICD-10-CM

## 2024-11-14 MED ORDER — ONDANSETRON HCL 4 MG PO TABS: 4.0000 mg | ORAL_TABLET | Freq: Three times a day (TID) | ORAL | 0 refills | Status: AC | PRN

## 2024-11-14 NOTE — Progress Notes (Signed)
 E visit for Flu like symptoms   We are sorry that you are not feeling well.  Here is how we plan to help! Based on what you have shared with me it looks like you may have a respiratory virus that may be influenza.  Influenza or the flu is  an infection caused by a respiratory virus. The flu virus is highly contagious and persons who did not receive their yearly flu vaccination may catch the flu from close contact.  We have anti-viral medications to treat the viruses that cause this infection. They are not a cure and only shorten the course of the infection. These prescriptions are most effective when they are given within the first 2 days of flu symptoms. Antiviral medications are indicated if you have a high risk of complications from the flu. You should  also consider an antiviral medication if you are in close contact with someone who is at risk. These medications can help patients avoid complications from the flu but have side effects that you should know.   Possible side effects from Tamiflu or oseltamivir include nausea, vomiting, diarrhea, dizziness, headaches, eye redness, sleep problems or other respiratory symptoms. You should not take Tamiflu if you have an allergy to oseltamivir or any to the ingredients in Tamiflu.    For nasal congestion, you may use an oral decongestant such as Mucinex D or if you have glaucoma or high blood pressure use plain Mucinex.  Saline nasal spray or nasal drops can help and can safely be used as often as needed for congestion.  If you have a sore or scratchy throat, use a saltwater gargle-  to  teaspoon of salt dissolved in a 4-ounce to 8-ounce glass of warm water.  Gargle the solution for approximately 15-30 seconds and then spit.  It is important not to swallow the solution.  You can also use throat lozenges/cough drops and Chloraseptic spray to help with throat pain or discomfort.  Warm or cold liquids can also be helpful in relieving throat  pain.  For headache, pain or general discomfort, you can use Ibuprofen or Tylenol as directed.   Some authorities believe that zinc sprays or the use of Echinacea may shorten the course of your symptoms.  I have prescribed the following medications to help lessen symptoms: I have prescribed Zofran  4 mg tablets 1 every 6 hours if needed for nausea  You are to isolate at home until you have been fever-free for at least 24 hours without a fever-reducing medication, and symptoms have been steadily improving for 24 hours.  If you must be around other household members who do not have symptoms, you need to make sure that both you and the family members are masking consistently with a high-quality mask.  If you note any worsening of symptoms despite treatment, please seek an in-person evaluation ASAP. If you note any significant shortness of breath or any chest pain, please seek ED evaluation. Please do not delay care!  ANYONE WHO HAS FLU SYMPTOMS SHOULD: Stay home. The flu is highly contagious and going out or to work exposes others! Be sure to drink plenty of fluids. Water is fine as well as fruit juices, sodas and electrolyte beverages. You may want to stay away from caffeine or alcohol. If you are nauseated, try taking small sips of liquids. How do you know if you are getting enough fluid? Your urine should be a pale yellow or almost colorless. Get rest. Taking a steamy shower or using  a humidifier may help nasal congestion and ease sore throat pain. Using a saline nasal spray works much the same way. Cough drops, hard candies and sore throat lozenges may ease your cough. Line up a caregiver. Have someone check on you regularly.  GET HELP RIGHT AWAY IF: You cannot keep down liquids or your medications. You become short of breath Your fell like you are going to pass out or loose consciousness. Your symptoms persist after you have completed your treatment plan  MAKE SURE YOU  Understand these  instructions. Will watch your condition. Will get help right away if you are not doing well or get worse.  Your e-visit answers were reviewed by a board certified advanced clinical practitioner to complete your personal care plan.  Depending on the condition, your plan could have included both over the counter or prescription medications.  If there is a problem please reply  once you have received a response from your provider.  Your safety is important to us .  If you have drug allergies check your prescription carefully.    You can use MyChart to ask questions about todays visit, request a non-urgent call back, or ask for a work or school excuse for 24 hours related to this e-Visit. If it has been greater than 24 hours you will need to follow up with your provider, or enter a new e-Visit to address those concerns.  You will get an e-mail in the next two days asking about your experience.  I hope that your e-visit has been valuable and will speed your recovery. Thank you for using e-visits.   I have spent 5 minutes in review of e-visit questionnaire, review and updating patient chart, medical decision making and response to patient.   Bari Learn, FNP

## 2024-12-21 ENCOUNTER — Other Ambulatory Visit: Payer: Self-pay | Admitting: Family Medicine

## 2024-12-21 DIAGNOSIS — F419 Anxiety disorder, unspecified: Secondary | ICD-10-CM

## 2024-12-22 NOTE — Telephone Encounter (Signed)
 Requested Prescriptions  Pending Prescriptions Disp Refills   busPIRone  (BUSPAR ) 5 MG tablet [Pharmacy Med Name: busPIRone  HCl 5 MG Oral Tablet] 120 tablet 0    Sig: TAKE 1 TO 2 TABLETS BY MOUTH TWICE DAILY AS NEEDED     Psychiatry: Anxiolytics/Hypnotics - Non-controlled Passed - 12/22/2024  1:20 PM      Passed - Valid encounter within last 12 months    Recent Outpatient Visits           3 months ago Hypertension, benign   [facility] Glenard Mire, MD   9 months ago Hypertension, benign   [facility] Glenard Mire, MD   11 months ago Well adult exam   [facility] Glenard Mire, MD

## 2025-01-10 ENCOUNTER — Encounter: Admitting: Family Medicine

## 2025-01-11 ENCOUNTER — Encounter: Admitting: Family Medicine

## 2025-03-18 ENCOUNTER — Ambulatory Visit: Admitting: Family Medicine

## 2025-04-05 ENCOUNTER — Encounter: Admission: RE | Payer: Self-pay | Source: Home / Self Care

## 2025-04-05 ENCOUNTER — Ambulatory Visit: Admission: RE | Admit: 2025-04-05 | Source: Home / Self Care | Admitting: Gastroenterology
